# Patient Record
Sex: Female | Born: 1980 | Race: White | Hispanic: No | Marital: Married | Smoking: Never smoker
Health system: Southern US, Community
[De-identification: ages and names within clinical notes are randomized; demographics above are authoritative.]

## PROBLEM LIST (undated history)

## (undated) ENCOUNTER — Inpatient Hospital Stay (HOSPITAL_COMMUNITY): Payer: Self-pay

## (undated) DIAGNOSIS — D219 Benign neoplasm of connective and other soft tissue, unspecified: Secondary | ICD-10-CM

## (undated) DIAGNOSIS — R000 Tachycardia, unspecified: Secondary | ICD-10-CM

## (undated) DIAGNOSIS — F32A Depression, unspecified: Secondary | ICD-10-CM

## (undated) DIAGNOSIS — Z8679 Personal history of other diseases of the circulatory system: Secondary | ICD-10-CM

## (undated) DIAGNOSIS — Z973 Presence of spectacles and contact lenses: Secondary | ICD-10-CM

## (undated) DIAGNOSIS — Z789 Other specified health status: Secondary | ICD-10-CM

## (undated) DIAGNOSIS — D649 Anemia, unspecified: Secondary | ICD-10-CM

## (undated) DIAGNOSIS — F419 Anxiety disorder, unspecified: Secondary | ICD-10-CM

## (undated) HISTORY — PX: WISDOM TOOTH EXTRACTION: SHX21

## (undated) HISTORY — PX: CHOLECYSTECTOMY: SHX55

---

## 2002-05-11 ENCOUNTER — Other Ambulatory Visit: Admission: RE | Admit: 2002-05-11 | Discharge: 2002-05-11 | Payer: Self-pay | Admitting: Obstetrics and Gynecology

## 2004-06-13 ENCOUNTER — Ambulatory Visit (HOSPITAL_COMMUNITY): Admission: RE | Admit: 2004-06-13 | Discharge: 2004-06-13 | Payer: Self-pay | Admitting: Obstetrics and Gynecology

## 2004-07-17 ENCOUNTER — Inpatient Hospital Stay (HOSPITAL_COMMUNITY): Admission: AD | Admit: 2004-07-17 | Discharge: 2004-07-17 | Payer: Self-pay | Admitting: Obstetrics and Gynecology

## 2004-07-26 ENCOUNTER — Inpatient Hospital Stay (HOSPITAL_COMMUNITY): Admission: AD | Admit: 2004-07-26 | Discharge: 2004-07-27 | Payer: Self-pay | Admitting: Obstetrics & Gynecology

## 2004-07-30 ENCOUNTER — Inpatient Hospital Stay (HOSPITAL_COMMUNITY): Admission: AD | Admit: 2004-07-30 | Discharge: 2004-07-30 | Payer: Self-pay | Admitting: Obstetrics and Gynecology

## 2004-08-14 ENCOUNTER — Inpatient Hospital Stay (HOSPITAL_COMMUNITY): Admission: AD | Admit: 2004-08-14 | Discharge: 2004-08-16 | Payer: Self-pay | Admitting: Obstetrics & Gynecology

## 2004-08-17 ENCOUNTER — Encounter: Admission: RE | Admit: 2004-08-17 | Discharge: 2004-08-28 | Payer: Self-pay | Admitting: Obstetrics & Gynecology

## 2006-08-24 ENCOUNTER — Emergency Department (HOSPITAL_COMMUNITY): Admission: EM | Admit: 2006-08-24 | Discharge: 2006-08-24 | Payer: Self-pay | Admitting: Emergency Medicine

## 2006-10-11 ENCOUNTER — Encounter: Admission: RE | Admit: 2006-10-11 | Discharge: 2006-10-23 | Payer: Self-pay | Admitting: Occupational Medicine

## 2006-11-15 ENCOUNTER — Emergency Department (HOSPITAL_COMMUNITY): Admission: EM | Admit: 2006-11-15 | Discharge: 2006-11-15 | Payer: Self-pay | Admitting: Emergency Medicine

## 2008-01-20 ENCOUNTER — Inpatient Hospital Stay (HOSPITAL_COMMUNITY): Admission: AD | Admit: 2008-01-20 | Discharge: 2008-01-20 | Payer: Self-pay | Admitting: Obstetrics and Gynecology

## 2008-01-30 HISTORY — PX: CHOLECYSTECTOMY: SHX55

## 2008-02-13 ENCOUNTER — Inpatient Hospital Stay (HOSPITAL_COMMUNITY): Admission: AD | Admit: 2008-02-13 | Discharge: 2008-02-13 | Payer: Self-pay | Admitting: Obstetrics and Gynecology

## 2008-02-16 ENCOUNTER — Inpatient Hospital Stay (HOSPITAL_COMMUNITY): Admission: AD | Admit: 2008-02-16 | Discharge: 2008-02-18 | Payer: Self-pay | Admitting: Obstetrics and Gynecology

## 2008-04-13 ENCOUNTER — Ambulatory Visit (HOSPITAL_COMMUNITY): Admission: RE | Admit: 2008-04-13 | Discharge: 2008-04-13 | Payer: Self-pay | Admitting: Gastroenterology

## 2008-05-31 ENCOUNTER — Encounter (INDEPENDENT_AMBULATORY_CARE_PROVIDER_SITE_OTHER): Payer: Self-pay | Admitting: General Surgery

## 2008-05-31 ENCOUNTER — Ambulatory Visit (HOSPITAL_COMMUNITY): Admission: RE | Admit: 2008-05-31 | Discharge: 2008-05-31 | Payer: Self-pay | Admitting: General Surgery

## 2008-06-08 ENCOUNTER — Ambulatory Visit: Payer: Self-pay | Admitting: Vascular Surgery

## 2008-06-08 ENCOUNTER — Ambulatory Visit: Admission: RE | Admit: 2008-06-08 | Discharge: 2008-06-08 | Payer: Self-pay | Admitting: General Surgery

## 2008-06-08 ENCOUNTER — Encounter (INDEPENDENT_AMBULATORY_CARE_PROVIDER_SITE_OTHER): Payer: Self-pay | Admitting: General Surgery

## 2010-04-19 ENCOUNTER — Other Ambulatory Visit: Payer: Self-pay | Admitting: Obstetrics and Gynecology

## 2010-04-19 DIAGNOSIS — N6310 Unspecified lump in the right breast, unspecified quadrant: Secondary | ICD-10-CM

## 2010-04-20 ENCOUNTER — Ambulatory Visit
Admission: RE | Admit: 2010-04-20 | Discharge: 2010-04-20 | Disposition: A | Discharge status: Home / Self Care | Payer: Federal, State, Local not specified - PPO | Source: Ambulatory Visit | Attending: Obstetrics and Gynecology | Admitting: Obstetrics and Gynecology

## 2010-04-20 DIAGNOSIS — N6310 Unspecified lump in the right breast, unspecified quadrant: Secondary | ICD-10-CM

## 2010-05-10 LAB — COMPREHENSIVE METABOLIC PANEL
ALT: 28 U/L (ref 0–35)
Calcium: 9.2 mg/dL (ref 8.4–10.5)
Creatinine, Ser: 0.49 mg/dL (ref 0.4–1.2)
Glucose, Bld: 94 mg/dL (ref 70–99)
Sodium: 136 mEq/L (ref 135–145)
Total Protein: 7.3 g/dL (ref 6.0–8.3)

## 2010-05-10 LAB — CBC
Hemoglobin: 13.6 g/dL (ref 12.0–15.0)
MCHC: 35.4 g/dL (ref 30.0–36.0)
MCV: 87.7 fL (ref 78.0–100.0)
RDW: 13.1 % (ref 11.5–15.5)

## 2010-05-10 LAB — DIFFERENTIAL
Eosinophils Absolute: 0.2 10*3/uL (ref 0.0–0.7)
Lymphocytes Relative: 29 % (ref 12–46)
Lymphs Abs: 1.6 10*3/uL (ref 0.7–4.0)
Monocytes Relative: 5 % (ref 3–12)
Neutro Abs: 3.5 10*3/uL (ref 1.7–7.7)
Neutrophils Relative %: 62 % (ref 43–77)

## 2010-05-10 LAB — HCG, SERUM, QUALITATIVE: Preg, Serum: NEGATIVE

## 2010-05-15 LAB — CBC
HCT: 30.9 % — ABNORMAL LOW (ref 36.0–46.0)
HCT: 36.7 % (ref 36.0–46.0)
Hemoglobin: 10.6 g/dL — ABNORMAL LOW (ref 12.0–15.0)
Hemoglobin: 12.6 g/dL (ref 12.0–15.0)
MCHC: 34.2 g/dL (ref 30.0–36.0)
MCV: 91.8 fL (ref 78.0–100.0)
MCV: 93.2 fL (ref 78.0–100.0)
RBC: 3.31 MIL/uL — ABNORMAL LOW (ref 3.87–5.11)
RBC: 4 MIL/uL (ref 3.87–5.11)
RDW: 13.9 % (ref 11.5–15.5)
WBC: 9.3 10*3/uL (ref 4.0–10.5)

## 2010-06-13 NOTE — Op Note (Signed)
Kelly Keller, Kelly Keller               ACCOUNT NO.:  [id number]   MEDICAL RECORD NO.:  [id number]          PATIENT TYPE:  AMB   LOCATION:  SDS                          FACILITY:  [facility]   PHYSICIAN:  Cherylynn Ridges, M.D.    DATE OF BIRTH:  30-Aug-1980   DATE OF PROCEDURE:  05/31/2008  DATE OF DISCHARGE:  05/31/2008                               OPERATIVE REPORT   PREOPERATIVE DIAGNOSIS:  Gallbladder polyps and symptoms.   POSTOPERATIVE DIAGNOSIS:  Gallbladder polyps and symptoms.   PROCEDURE:  Laparoscopic cholecystectomy.   SURGEON:  Marta Lamas. Lindie Spruce, MD   ASSISTANT:  Amber L. Freida Busman, MD   ANESTHESIA:  General endotracheal.   ESTIMATED BLOOD LOSS:  Less than 20 mL.   No complications.   CONDITION:  Stable.   FINDINGS:  There were adhesions of omentum in duodenum to the  infundibulum of the gallbladder and the body, no cholangiogram was  performed.   INDICATIONS FOR OPERATION:  The patient is a 30 year old with noted  gallbladder polyps on ultrasound with symptoms associated with that who  now comes in for an elective laparoscopic cholecystectomy.   OPERATION:  The patient was taken to the operating room, placed on the  table in supine position.  After an adequate general endotracheal  anesthetic was administered, she was prepped and draped in the usual  sterile manner exposing the entire abdomen.   An infraumbilical midline incision was made using a #15 blade about 1 to  1.5 cm long down into the subcutaneous tissue to the midline fascia.  We  grabbed the fascia with 2 Kocher clamps and then incising the fascia  between the clamps into the preperitoneal space.  We bluntly dissected  down into the peritoneal cavity using a Kelly clamp, then once that was  done, we passed a purse-string suture of 0 Vicryl around the fascial  opening.  Then, secured in a Hasson cannula into the peritoneal cavity,  which we subsequently insufflated carbon dioxide gas up to a maximal  intraabdominal pressure of 15 mmHg.   Once the gas was in the abdomen, two right upper quadrant 5-mm cannula  and a subxiphoid 5-mm cannula passed under direct vision.  We then  switched to a 5-mm scope, then placed the patient in reverse  Trendelenburg and left side was tilted down and the dissection begun.   As we grabbed the dome of the gallbladder, retracted it towards the  anterior abdominal wall in the right upper quadrant, there were noted  adhesions to the infundibulum and the body.  We dissected these away  using blunt dissection and also electrocautery taking care to stay away  from the duodenum, which was also tethered by the adhesions.   We were able to dissect out the peritoneum overlying the triangle of  Calot and hepatoduodenal triangle isolating both the cystic duct and the  cystic artery providing adequate windows from both structures.  Endoclips were placed on the cystic duct distally x3 and then proximally  x2, and then we transected the cystic duct.  We then placed 2 clips  proximally and then 2  clips distally along the cystic artery, transected  it and then dissected out the gallbladder from its bed with minimal  difficulty making a small entrance into the gallbladder with spillage of  bile.  We used an EndoCatch bag to retrieve the gallbladder from the  infraumbilical site.  We then passed the scope back into the peritoneal  cavity cauterizing the hepatic bed for adequate hemostasis, and once  this was done, we removed all cannulas, aspirated all fluid and gas from  above the liver.   Once the cannulas were removed, the infraumbilical fascia site was  closed using a pursestring suture, which was in place.  We injected  0.25% Marcaine with epi at all sites.  We closed the infraumbilical site  using a running subcuticular stitch of 4-0 Monocryl.  We used Dermabond,  Steri-Strips, and Tegaderms to complete our dressings at all sites.  All  needle counts, sponge  counts, and instrument counts were correct.      Cherylynn Ridges, M.D.  Electronically Signed     JOW/MEDQ  D:  05/31/2008  T:  05/31/2008  Job:  [id number]   cc:   Anselmo Rod, M.D.

## 2010-06-13 NOTE — H&P (Signed)
Kelly Keller, Kelly Keller               ACCOUNT NO.:  [id number]   MEDICAL RECORD NO.:  [id number]          PATIENT TYPE:  INP   LOCATION:  9169                          FACILITY:  [facility]   PHYSICIAN:  Crist Fat. Rivard, M.D. DATE OF BIRTH:  06-23-1980   DATE OF ADMISSION:  02/16/2008  DATE OF DISCHARGE:                              HISTORY & PHYSICAL   This is a 30 year old gravida 2, para 1-0-0-1 at 33 and 6/7 weeks' who  presents with contractions every 3 minutes x2 hours.  She denies  leaking, reports positive fetal movement.  She did have 3 blood clots at  home.  Pregnancy has been followed by the Nurse-Midwife Service since 33  weeks and remarkable for history of asthma, history of preterm labor,  and Group B strep negative.  The patient is screaming with contractions  and unable to relax between, [place] desires epidural.   ALLERGIES:  PENICILLIN causes hives.   OB HISTORY:  Remarkable for vaginal delivery in 2006, a live female infant  at 70 weeks' gestation, weighing 6 pounds 12 ounces.  Remarkable for  preterm labor with term delivery.   MEDICAL HISTORY:  Remarkable for childhood varicella and history of  asthma.   SURGICAL HISTORY:  Negative.   FAMILY HISTORY:  Remarkable for grandmother with heart disease and  hypertension.  Son with asthma.  Mother with diabetes.  Grandmother with  thyroid problems.  Sister with seizures.  Grandmother with stomach  cancer.   GENETIC HISTORY:  Remarkable for father of the baby with twins, who is A  twin.   SOCIAL HISTORY:  The patient is married to Berneta Sages who is  involved and supportive.  She is of the [employer].  She denies any  alcohol, tobacco, or drug use.   PRENATAL LABS:  Hemoglobin 11.4 and platelets 191.  Blood type O  positive, antibody screen negative, RPR nonreactive, rubella immune,  hepatitis negative, HIV negative, Pap test normal, gonorrhea negative,  and Chlamydia negative.   HISTORY OF CURRENT PREGNANCY:  The  patient entered care at first  trimester at [facility], had an uneventful pregnancy with normal  genetic screening and a normal Glucola.  She transferred to [facility] at 33 weeks.  She had some preterm cramping at 33 weeks, but  had a negative fetal fibronectin.  She requested elective induction at  37 weeks' due to husband's trouble, but was told induction was not  recommended prior to 39 weeks.  She had group B strep negative at term  and presents today in labor.   OBJECTIVE:  VITAL SIGNS:  Stable and afebrile.  HEENT:  Within normal limits.  NECK:  Thyroid normal, not enlarged.  CHEST:  Clear to auscultation.  HEART:  Regular rate and rhythm.  ABDOMEN:  Gravid.  Vertex Leopold.  EFM shows reactive fetal heart rate  with contractions every 3 minutes.  Cervix is 3-4 cm, 50% effaced, -2  station with vertex presentation.  EXTREMITIES:  Within normal limits.   ASSESSMENT:  1. Intrauterine pregnancy at 37 and 6/7 weeks.  2. Early active labor.  PLAN:  1. Admit to birthing suites.  2. Routine CNM orders.  3. Epidural.  4. I reviewed with the patient that if contractions slowed down we may      need Pitocin augmentation later and she is agreeable.      Marie L. Williams, C.N.M.      Crist Fat Rivard, M.D.  Electronically Signed    MLW/MEDQ  D:  02/16/2008  T:  02/16/2008  Job:  [id number]

## 2010-06-16 NOTE — H&P (Signed)
NAMELASHUN, RAMSEYER               ACCOUNT NO.:  [id number]   MEDICAL RECORD NO.:  [id number]          PATIENT TYPE:  INP   LOCATION:  9168                          FACILITY:  [facility]   PHYSICIAN:  Richardean Sale, M.D.   DATE OF BIRTH:  11/14/1980   DATE OF ADMISSION:  08/14/2004  DATE OF DISCHARGE:                                HISTORY & PHYSICAL   ADMITTING DIAGNOSES:  A 40+ week intrauterine pregnancy for induction of  labor.   HISTORY OF PRESENT ILLNESS:  This is a 30 year old gravida 1, para 0 white  female who is at 98+ weeks gestation with a due date of August 13, 2004 by  first trimester ultrasound who presents today for induction of labor.  Patient's husband is being required to leave town on August 15, 2004 and will  be out of the country.  Patient reports good movement.  Denies loss of  fluid, vaginal contractions.  Prenatal care has been at [facility] with  Dr. Richardean Sale as the primary attending.  Pregnancy has been  uncomplicated.   PAST OBSTETRICAL HISTORY:  Gravida 1, para 0.   PAST GYNECOLOGICAL HISTORY:  No history of abnormal Pap smears or sexually  transmitted infections.   PAST MEDICAL HISTORY:  No prior hospitalizations.  She does have a remote  history of asthma.  Not currently on any medications.  Has never been  hospitalized for this.   PAST SURGICAL HISTORY:  None.   SOCIAL HISTORY:  She is married.  Denies tobacco, alcohol, or drugs.   FAMILY HISTORY:  Positive for hypertension, coronary artery disease in her  grandparents as well as adult-onset insulin-dependent diabetes.  No known  birth defects, congenital anomalies, cystic fibrosis, Down syndrome, or  spina bifida on either side of the family.   MEDICATIONS:  Prenatal vitamins.   ALLERGIES:  PENICILLIN causes hives.   PHYSICAL EXAMINATION:  VITAL SIGNS:  She is afebrile.  Vital signs are  stable.  GENERAL:  She is a well-developed, well-nourished white female who is in no  acute  distress.  HEART:  Regular rate and rhythm.  LUNGS:  Clear to auscultation bilaterally.  ABDOMEN:  Gravid, soft, and nontender.  Fundal height is appropriate.  EXTREMITIES:  Trace edema in the feet bilaterally, nontender.  PELVIC:  Cervix is 2-3 cm, 60%, -1 station, vertex.   Ultrasound on August 08, 2004 reveals estimated fetal weight of 3740 g which  is 70th percentile.  Amniotic fluid index is normal.  Position is vertex.   PRENATAL LABORATORIES:  Blood type is O+.  Antibody screen negative.  RPR  nonreactive.  Rubella immune.  Hepatitis B surface antigen nonreactive.  HIV  nonreactive.  One-hour Glucola was elevated at 142.  Three-hour Glucola was  normal with all four values being within normal limits.  Group B beta Strep  is negative.  Patient declined any prenatal testing such as ultra screen,  quad screen, or CF carrier screen.   ASSESSMENT:  A 40+ week intrauterine pregnancy for induction of labor.   PLAN:  1.  Reviewed with patient and husband that this  is an elective induction and      given that she is a primigravida she does have an increased risk of      cesarean section.  The patient and husband voiced understanding of this      risk and desire to proceed with induction.  The risks and benefits of      induction have been reviewed with the patient in detail.  Informed      consent has been obtained.  2.  Will admit to labor and delivery.  3.  Start Pitocin per protocol.  Perform amniotomy when appropriate.  4.  Continue fetal monitoring.  5.  Anticipate attempts at vaginal delivery.       JW/MEDQ  D:  08/14/2004  T:  08/14/2004  Job:  [id number]

## 2013-02-07 ENCOUNTER — Encounter (HOSPITAL_BASED_OUTPATIENT_CLINIC_OR_DEPARTMENT_OTHER): Payer: Self-pay | Admitting: Emergency Medicine

## 2013-02-07 ENCOUNTER — Emergency Department (HOSPITAL_BASED_OUTPATIENT_CLINIC_OR_DEPARTMENT_OTHER)
Admission: EM | Admit: 2013-02-07 | Discharge: 2013-02-07 | Disposition: A | Discharge status: Home / Self Care | Payer: Federal, State, Local not specified - PPO | Attending: Emergency Medicine | Admitting: Emergency Medicine

## 2013-02-07 DIAGNOSIS — S79929A Unspecified injury of unspecified thigh, initial encounter: Secondary | ICD-10-CM

## 2013-02-07 DIAGNOSIS — S161XXA Strain of muscle, fascia and tendon at neck level, initial encounter: Secondary | ICD-10-CM

## 2013-02-07 DIAGNOSIS — Y92410 Unspecified street and highway as the place of occurrence of the external cause: Secondary | ICD-10-CM | POA: Insufficient documentation

## 2013-02-07 DIAGNOSIS — M79602 Pain in left arm: Secondary | ICD-10-CM

## 2013-02-07 DIAGNOSIS — S79919A Unspecified injury of unspecified hip, initial encounter: Secondary | ICD-10-CM | POA: Insufficient documentation

## 2013-02-07 DIAGNOSIS — Y9389 Activity, other specified: Secondary | ICD-10-CM | POA: Insufficient documentation

## 2013-02-07 DIAGNOSIS — S4980XA Other specified injuries of shoulder and upper arm, unspecified arm, initial encounter: Secondary | ICD-10-CM | POA: Insufficient documentation

## 2013-02-07 DIAGNOSIS — S139XXA Sprain of joints and ligaments of unspecified parts of neck, initial encounter: Secondary | ICD-10-CM | POA: Insufficient documentation

## 2013-02-07 DIAGNOSIS — Z880 Allergy status to penicillin: Secondary | ICD-10-CM | POA: Insufficient documentation

## 2013-02-07 DIAGNOSIS — S46909A Unspecified injury of unspecified muscle, fascia and tendon at shoulder and upper arm level, unspecified arm, initial encounter: Secondary | ICD-10-CM | POA: Insufficient documentation

## 2013-02-07 DIAGNOSIS — M25552 Pain in left hip: Secondary | ICD-10-CM

## 2013-02-07 MED ORDER — CYCLOBENZAPRINE HCL 10 MG PO TABS: 10.0000 mg | ORAL_TABLET | Freq: Two times a day (BID) | ORAL | Status: DC | PRN
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
## 2013-02-07 NOTE — Discharge Instructions (Signed)
Use ice and ibuprofen or aleve for pain. Use flexeril or heat for muscle spasms.  If you were given medicines take as directed.  If you are on coumadin or contraceptives realize their levels and effectiveness is altered by many different medicines.  If you have any reaction (rash, tongues swelling, other) to the medicines stop taking and see a physician.   Please follow up as directed and return to the ER or see a physician for new or worsening symptoms (weakness, bone pain, other).  Thank you.

## 2014-09-24 ENCOUNTER — Other Ambulatory Visit: Payer: Self-pay | Admitting: Obstetrics & Gynecology

## 2014-09-27 LAB — CYTOLOGY - PAP

## 2015-07-06 DIAGNOSIS — K080 Exfoliation of teeth due to systemic causes: Secondary | ICD-10-CM | POA: Diagnosis not present

## 2015-07-29 DIAGNOSIS — N812 Incomplete uterovaginal prolapse: Secondary | ICD-10-CM | POA: Diagnosis not present

## 2015-08-23 DIAGNOSIS — K080 Exfoliation of teeth due to systemic causes: Secondary | ICD-10-CM | POA: Diagnosis not present

## 2015-10-04 DIAGNOSIS — Z6833 Body mass index (BMI) 33.0-33.9, adult: Secondary | ICD-10-CM | POA: Diagnosis not present

## 2015-10-04 DIAGNOSIS — Z01419 Encounter for gynecological examination (general) (routine) without abnormal findings: Secondary | ICD-10-CM | POA: Diagnosis not present

## 2015-10-11 DIAGNOSIS — Z1321 Encounter for screening for nutritional disorder: Secondary | ICD-10-CM | POA: Diagnosis not present

## 2015-10-11 DIAGNOSIS — Z1329 Encounter for screening for other suspected endocrine disorder: Secondary | ICD-10-CM | POA: Diagnosis not present

## 2015-10-11 DIAGNOSIS — Z13220 Encounter for screening for lipoid disorders: Secondary | ICD-10-CM | POA: Diagnosis not present

## 2015-10-11 DIAGNOSIS — R2231 Localized swelling, mass and lump, right upper limb: Secondary | ICD-10-CM | POA: Diagnosis not present

## 2015-10-11 DIAGNOSIS — Z13228 Encounter for screening for other metabolic disorders: Secondary | ICD-10-CM | POA: Diagnosis not present

## 2016-01-03 DIAGNOSIS — F419 Anxiety disorder, unspecified: Secondary | ICD-10-CM | POA: Diagnosis not present

## 2016-01-03 DIAGNOSIS — Z13220 Encounter for screening for lipoid disorders: Secondary | ICD-10-CM | POA: Diagnosis not present

## 2016-01-03 DIAGNOSIS — Z0000 Encounter for general adult medical examination without abnormal findings: Secondary | ICD-10-CM | POA: Diagnosis not present

## 2016-02-03 DIAGNOSIS — F419 Anxiety disorder, unspecified: Secondary | ICD-10-CM | POA: Diagnosis not present

## 2016-04-10 DIAGNOSIS — K080 Exfoliation of teeth due to systemic causes: Secondary | ICD-10-CM | POA: Diagnosis not present

## 2016-04-26 DIAGNOSIS — F419 Anxiety disorder, unspecified: Secondary | ICD-10-CM | POA: Diagnosis not present

## 2016-10-16 LAB — BASIC METABOLIC PANEL: Glucose: 97

## 2016-10-25 DIAGNOSIS — F419 Anxiety disorder, unspecified: Secondary | ICD-10-CM | POA: Diagnosis not present

## 2016-10-29 DIAGNOSIS — K080 Exfoliation of teeth due to systemic causes: Secondary | ICD-10-CM | POA: Diagnosis not present

## 2016-11-12 DIAGNOSIS — M79675 Pain in left toe(s): Secondary | ICD-10-CM | POA: Diagnosis not present

## 2016-11-12 DIAGNOSIS — L600 Ingrowing nail: Secondary | ICD-10-CM | POA: Diagnosis not present

## 2016-11-26 DIAGNOSIS — L600 Ingrowing nail: Secondary | ICD-10-CM | POA: Diagnosis not present

## 2016-11-26 DIAGNOSIS — M79675 Pain in left toe(s): Secondary | ICD-10-CM | POA: Diagnosis not present

## 2017-04-25 DIAGNOSIS — F419 Anxiety disorder, unspecified: Secondary | ICD-10-CM | POA: Diagnosis not present

## 2017-05-06 DIAGNOSIS — N911 Secondary amenorrhea: Secondary | ICD-10-CM | POA: Diagnosis not present

## 2017-05-06 DIAGNOSIS — F411 Generalized anxiety disorder: Secondary | ICD-10-CM | POA: Diagnosis not present

## 2017-05-20 DIAGNOSIS — N911 Secondary amenorrhea: Secondary | ICD-10-CM | POA: Diagnosis not present

## 2017-05-21 DIAGNOSIS — K080 Exfoliation of teeth due to systemic causes: Secondary | ICD-10-CM | POA: Diagnosis not present

## 2017-05-27 DIAGNOSIS — Z3481 Encounter for supervision of other normal pregnancy, first trimester: Secondary | ICD-10-CM | POA: Diagnosis not present

## 2017-05-27 DIAGNOSIS — Z3685 Encounter for antenatal screening for Streptococcus B: Secondary | ICD-10-CM | POA: Diagnosis not present

## 2017-05-27 DIAGNOSIS — Z13228 Encounter for screening for other metabolic disorders: Secondary | ICD-10-CM | POA: Diagnosis not present

## 2017-06-17 DIAGNOSIS — Z3401 Encounter for supervision of normal first pregnancy, first trimester: Secondary | ICD-10-CM | POA: Diagnosis not present

## 2017-06-17 DIAGNOSIS — Z113 Encounter for screening for infections with a predominantly sexual mode of transmission: Secondary | ICD-10-CM | POA: Diagnosis not present

## 2017-06-17 DIAGNOSIS — Z3A11 11 weeks gestation of pregnancy: Secondary | ICD-10-CM | POA: Diagnosis not present

## 2017-06-17 DIAGNOSIS — O26851 Spotting complicating pregnancy, first trimester: Secondary | ICD-10-CM | POA: Diagnosis not present

## 2017-06-17 DIAGNOSIS — Z3480 Encounter for supervision of other normal pregnancy, unspecified trimester: Secondary | ICD-10-CM | POA: Diagnosis not present

## 2017-07-01 DIAGNOSIS — Z3682 Encounter for antenatal screening for nuchal translucency: Secondary | ICD-10-CM | POA: Diagnosis not present

## 2017-07-01 DIAGNOSIS — O09521 Supervision of elderly multigravida, first trimester: Secondary | ICD-10-CM | POA: Diagnosis not present

## 2017-07-01 DIAGNOSIS — Z3A13 13 weeks gestation of pregnancy: Secondary | ICD-10-CM | POA: Diagnosis not present

## 2017-08-08 DIAGNOSIS — Z3480 Encounter for supervision of other normal pregnancy, unspecified trimester: Secondary | ICD-10-CM | POA: Diagnosis not present

## 2017-08-08 DIAGNOSIS — Z363 Encounter for antenatal screening for malformations: Secondary | ICD-10-CM | POA: Diagnosis not present

## 2017-08-08 DIAGNOSIS — Z3A18 18 weeks gestation of pregnancy: Secondary | ICD-10-CM | POA: Diagnosis not present

## 2017-08-28 ENCOUNTER — Other Ambulatory Visit: Payer: Self-pay

## 2017-08-28 ENCOUNTER — Encounter (HOSPITAL_COMMUNITY): Payer: Self-pay | Admitting: *Deleted

## 2017-08-28 ENCOUNTER — Inpatient Hospital Stay (HOSPITAL_COMMUNITY)
Admission: AD | Admit: 2017-08-28 | Discharge: 2017-08-28 | Disposition: A | Discharge status: Home / Self Care | Payer: Federal, State, Local not specified - PPO | Source: Ambulatory Visit | Attending: Obstetrics and Gynecology | Admitting: Obstetrics and Gynecology

## 2017-08-28 DIAGNOSIS — O479 False labor, unspecified: Secondary | ICD-10-CM

## 2017-08-28 DIAGNOSIS — Z3A22 22 weeks gestation of pregnancy: Secondary | ICD-10-CM | POA: Insufficient documentation

## 2017-08-28 DIAGNOSIS — Z880 Allergy status to penicillin: Secondary | ICD-10-CM | POA: Diagnosis not present

## 2017-08-28 DIAGNOSIS — O26852 Spotting complicating pregnancy, second trimester: Secondary | ICD-10-CM | POA: Insufficient documentation

## 2017-08-28 HISTORY — DX: Other specified health status: Z78.9

## 2017-08-28 NOTE — MAU Provider Note (Signed)
History     CSN: [id number]  Arrival date and time: 08/28/17 1657   First Provider Initiated Contact with Patient 08/28/17 1739      Chief Complaint  Patient presents with  . Contractions  . Vaginal Bleeding  . pelvic pressure   HPI  Ms. Kelly Keller is a 37 y.o. G3P2002 at [redacted]w[redacted]d who presents to MAU today with complaint of dark brown spotting x 2 days. She states that she also noted 3 dime sized blood clots earlier today which prompted her to call the office. She has had "braxton hicks" contractions 2-6 times/hour over the last 2-3 days. Contractions stop with rest and resume with activity. She reports normal fetal movement for this GA. She denies complications with this pregnancy. She had PTL with last pregnancy and had 2 MAU visits for "shots" to stop her contractions, but delivered at 37 weeks. Last [place] in the office was normal per patient, but incomplete views of heart were noted. Plan to repeat next week. She denies recent intercourse or exam. Last intercourse last week.   OB History    Gravida  3   Para  2   Term  2   Preterm      AB      Living  2     SAB      TAB      Ectopic      Multiple      Live Births  2           Past Medical History:  Diagnosis Date  . Medical history non-contributory     Past Surgical History:  Procedure Laterality Date  . CHOLECYSTECTOMY    . WISDOM TOOTH EXTRACTION Bilateral     Family History  Problem Relation Age of Onset  . Heart attack Mother     Social History   Tobacco Use  . Smoking status: Never Smoker  . Smokeless tobacco: Never Used  Substance Use Topics  . Alcohol use: Never    Frequency: Never  . Drug use: Never    Allergies:  Allergies  Allergen Reactions  . Penicillins Rash  . Tylenol With Codeine #3 [Acetaminophen-Codeine] Rash    Medications Prior to Admission  Medication Sig Dispense Refill Last Dose  . cyclobenzaprine (FLEXERIL) 10 MG tablet Take 1 tablet (10 mg total) by mouth 2  (two) times daily as needed for muscle spasms. 10 tablet 0     Review of Systems  Constitutional: Negative for fever.  Gastrointestinal: Positive for abdominal pain. Negative for constipation, diarrhea, nausea and vomiting.  Genitourinary: Positive for vaginal bleeding and vaginal discharge. Negative for dysuria, frequency and urgency.   Physical Exam   Blood pressure (!) 144/77, pulse (!) 111, temperature 98.8 F (37.1 C), temperature source Oral, resp. rate 17, weight 206 lb (93.4 kg), SpO2 100 %.  Physical Exam  Nursing note and vitals reviewed. Constitutional: She is oriented to person, place, and time. She appears well-developed and well-nourished. No distress.  HENT:  Head: Normocephalic and atraumatic.  Cardiovascular: Normal rate.  Respiratory: Effort normal.  GI: Soft. She exhibits no distension and no mass. There is no tenderness. There is no rebound and no guarding.  Genitourinary: Uterus is enlarged. Uterus is not tender. Cervix exhibits no motion tenderness, no discharge and no friability. There is bleeding (scant light brown in mucous) in the vagina. Vaginal discharge (moderate mucous) found.  Neurological: She is alert and oriented to person, place, and time.  Skin: Skin is  warm and dry. No erythema.  Psychiatric: She has a normal mood and affect.  Dilation: Closed Effacement (%): Thick Cervical Position: Posterior Exam by:: Kerry Hough, PA-C   MAU Course  Procedures None  MDM FHR - 146 bpm with doppler  Discussed patient with Dr. Julien Girt. If no evidence of cervical insufficiency or PTL ok for discharge with precautions and follow-up as scheduled next week.  Cervix closed, thick. Abdomen soft and non-tender. Normal FHR.  Assessment and Plan  A: [facility] at [redacted]w[redacted]d Spotting in pregnancy, second trimester   P: Discharge home Bleeding precautions and pelvic rest discussed Discussed use of abdominal binder and hydrotherapy for relief of abdominal pain Avoid heavy  lifting or strenuous activity  Patient advised to follow-up with [facility] as scheduled next week or sooner if symptoms worsen Patient may return to MAU as needed or if her condition were to change or worsen   Kerry Hough, PA-C 08/28/2017, 6:26 PM

## 2017-08-28 NOTE — MAU Note (Signed)
Urine in lab 

## 2017-08-28 NOTE — MAU Note (Signed)
Last 2 days has had brown spotted, heavier today- passed 3 dime sized clots.  Has been having [facility], feeling more pressure. Has been told she has a prolapsed uterus.  Has been constipated

## 2017-08-28 NOTE — Discharge Instructions (Signed)
Braxton Hicks Contractions °Contractions of the uterus can occur throughout pregnancy, but they are not always a sign that you are in labor. You may have practice contractions called Braxton Hicks contractions. These false labor contractions are sometimes confused with true labor. °What are Braxton Hicks contractions? °Braxton Hicks contractions are tightening movements that occur in the muscles of the uterus before labor. Unlike true labor contractions, these contractions do not result in opening (dilation) and thinning of the cervix. Toward the end of pregnancy (32-34 weeks), Braxton Hicks contractions can happen more often and may become stronger. These contractions are sometimes difficult to tell apart from true labor because they can be very uncomfortable. You should not feel embarrassed if you go to the hospital with false labor. °Sometimes, the only way to tell if you are in true labor is for your health care provider to look for changes in the cervix. The health care provider will do a physical exam and may monitor your contractions. If you are not in true labor, the exam should show that your cervix is not dilating and your water has not broken. °If there are other health problems associated with your pregnancy, it is completely safe for you to be sent home with false labor. You may continue to have Braxton Hicks contractions until you go into true labor. °How to tell the difference between true labor and false labor °True labor °· Contractions last 30-70 seconds. °· Contractions become very regular. °· Discomfort is usually felt in the top of the uterus, and it spreads to the lower abdomen and low back. °· Contractions do not go away with walking. °· Contractions usually become more intense and increase in frequency. °· The cervix dilates and gets thinner. °False labor °· Contractions are usually shorter and not as strong as true labor contractions. °· Contractions are usually irregular. °· Contractions  are often felt in the front of the lower abdomen and in the groin. °· Contractions may go away when you walk around or change positions while lying down. °· Contractions get weaker and are shorter-lasting as time goes on. °· The cervix usually does not dilate or become thin. °Follow these instructions at home: °· Take over-the-counter and prescription medicines only as told by your health care provider. °· Keep up with your usual exercises and follow other instructions from your health care provider. °· Eat and drink lightly if you think you are going into labor. °· If Braxton Hicks contractions are making you uncomfortable: °? Change your position from lying down or resting to walking, or change from walking to resting. °? Sit and rest in a tub of warm water. °? Drink enough fluid to keep your urine pale yellow. Dehydration may cause these contractions. °? Do slow and deep breathing several times an hour. °· Keep all follow-up prenatal visits as told by your health care provider. This is important. °Contact a health care provider if: °· You have a fever. °· You have continuous pain in your abdomen. °Get help right away if: °· Your contractions become stronger, more regular, and closer together. °· You have fluid leaking or gushing from your vagina. °· You pass blood-tinged mucus (bloody show). °· You have bleeding from your vagina. °· You have low back pain that you never had before. °· You feel your baby’s head pushing down and causing pelvic pressure. °· Your baby is not moving inside you as much as it used to. °Summary °· Contractions that occur before labor are called Braxton   Hicks contractions, false labor, or practice contractions.  Braxton Hicks contractions are usually shorter, weaker, farther apart, and less regular than true labor contractions. True labor contractions usually become progressively stronger and regular and they become more frequent.  Manage discomfort from [facility] contractions by  changing position, resting in a warm bath, drinking plenty of water, or practicing deep breathing. This information is not intended to replace advice given to you by your health care provider. Make sure you discuss any questions you have with your health care provider. Document Released: 05/31/2016 Document Revised: 05/31/2016 Document Reviewed: 05/31/2016 Elsevier Interactive Patient Education  2018 [employer].   Pelvic Rest Pelvic rest may be recommended if:  Your placenta is partially or completely covering the opening of your cervix (placenta previa).  There is bleeding between the wall of the uterus and the amniotic sac in the first trimester of pregnancy (subchorionic hemorrhage).  You went into labor too early (preterm labor).  Based on your overall health and the health of your baby, your health care provider will decide if pelvic rest is right for you. How do I rest my pelvis? For as long as told by your health care provider:  Do not have sex, sexual stimulation, or an orgasm.  Do not use tampons. Do not douche. Do not put anything in your vagina.  Do not lift anything that is heavier than 10 lb (4.5 kg).  Avoid activities that take a lot of effort (are strenuous).  Avoid any activity in which your pelvic muscles could become strained.  When should I seek medical care? Seek medical care if you have:  Cramping pain in your lower abdomen.  Vaginal discharge.  A low, dull backache.  Regular contractions.  Uterine tightening.  When should I seek immediate medical care? Seek immediate medical care if:  You have vaginal bleeding and you are pregnant.  This information is not intended to replace advice given to you by your health care provider. Make sure you discuss any questions you have with your health care provider. Document Released: 05/12/2010 Document Revised: 06/23/2015 Document Reviewed: 07/19/2014 Elsevier Interactive Patient Education  2018 [employer].   Preterm Labor and Birth Information Pregnancy normally lasts 39-41 weeks. Preterm labor is when labor starts early. It starts before you have been pregnant for 37 whole weeks. What are the risk factors for preterm labor? Preterm labor is more likely to occur in women who:  Have an infection while pregnant.  Have a cervix that is short.  Have gone into preterm labor before.  Have had surgery on their cervix.  Are younger than age 16.  Are older than age 59.  Are African American.  Are pregnant with two or more babies.  Take street drugs while pregnant.  Smoke while pregnant.  Do not gain enough weight while pregnant.  Got pregnant right after another pregnancy.  What are the symptoms of preterm labor? Symptoms of preterm labor include:  Cramps. The cramps may feel like the cramps some women get during their period. The cramps may happen with watery poop (diarrhea).  Pain in the belly (abdomen).  Pain in the lower back.  Regular contractions or tightening. It may feel like your belly is getting tighter.  Pressure in the lower belly that seems to get stronger.  More fluid (discharge) leaking from the vagina. The fluid may be watery or bloody.  Water breaking.  Why is it important to notice signs of preterm labor? Babies who are born  early may not be fully developed. They have a higher chance for:  Long-term heart problems.  Long-term lung problems.  Trouble controlling body systems, like breathing.  Bleeding in the brain.  A condition called cerebral palsy.  Learning difficulties.  Death.  These risks are highest for babies who are born before 1 weeks of pregnancy. How is preterm labor treated? Treatment depends on:  How long you were pregnant.  Your condition.  The health of your baby.  Treatment may involve:  Having a stitch (suture) placed in your cervix. When you give birth, your cervix opens so the baby can come out. The stitch  keeps the cervix from opening too soon.  Staying at the hospital.  Taking or getting medicines, such as: ? Hormone medicines. ? Medicines to stop contractions. ? Medicines to help the babys lungs develop. ? Medicines to prevent your baby from having cerebral palsy.  What should I do if I am in preterm labor? If you think you are going into labor too soon, call your doctor right away. How can I prevent preterm labor?  Do not use any tobacco products. ? Examples of these are cigarettes, chewing tobacco, and e-cigarettes. ? If you need help quitting, ask your doctor.  Do not use street drugs.  Do not use any medicines unless you ask your doctor if they are safe for you.  Talk with your doctor before taking any herbal supplements.  Make sure you gain enough weight.  Watch for infection. If you think you might have an infection, get it checked right away.  If you have gone into preterm labor before, tell your doctor. This information is not intended to replace advice given to you by your health care provider. Make sure you discuss any questions you have with your health care provider. Document Released: 04/13/2008 Document Revised: 06/28/2015 Document Reviewed: 06/08/2015 Elsevier Interactive Patient Education  2018 [employer].

## 2017-09-03 DIAGNOSIS — Z362 Encounter for other antenatal screening follow-up: Secondary | ICD-10-CM | POA: Diagnosis not present

## 2017-09-03 DIAGNOSIS — Z3A22 22 weeks gestation of pregnancy: Secondary | ICD-10-CM | POA: Diagnosis not present

## 2017-10-01 DIAGNOSIS — Z3A26 26 weeks gestation of pregnancy: Secondary | ICD-10-CM | POA: Diagnosis not present

## 2017-10-01 DIAGNOSIS — O4403 Placenta previa specified as without hemorrhage, third trimester: Secondary | ICD-10-CM | POA: Diagnosis not present

## 2017-10-01 DIAGNOSIS — Z23 Encounter for immunization: Secondary | ICD-10-CM | POA: Diagnosis not present

## 2017-10-01 DIAGNOSIS — Z3480 Encounter for supervision of other normal pregnancy, unspecified trimester: Secondary | ICD-10-CM | POA: Diagnosis not present

## 2017-10-15 DIAGNOSIS — O4403 Placenta previa specified as without hemorrhage, third trimester: Secondary | ICD-10-CM | POA: Diagnosis not present

## 2017-10-15 DIAGNOSIS — Z3A28 28 weeks gestation of pregnancy: Secondary | ICD-10-CM | POA: Diagnosis not present

## 2017-10-16 ENCOUNTER — Inpatient Hospital Stay (HOSPITAL_BASED_OUTPATIENT_CLINIC_OR_DEPARTMENT_OTHER): Payer: Federal, State, Local not specified - PPO

## 2017-10-16 ENCOUNTER — Encounter (HOSPITAL_COMMUNITY): Payer: Self-pay | Admitting: *Deleted

## 2017-10-16 ENCOUNTER — Other Ambulatory Visit: Payer: Self-pay

## 2017-10-16 ENCOUNTER — Inpatient Hospital Stay (HOSPITAL_COMMUNITY)
Admission: AD | Admit: 2017-10-16 | Discharge: 2017-10-19 | DRG: 833 | Disposition: A | Discharge status: Home / Self Care | Payer: Federal, State, Local not specified - PPO | Attending: Obstetrics and Gynecology | Admitting: Obstetrics and Gynecology

## 2017-10-16 DIAGNOSIS — O4413 Placenta previa with hemorrhage, third trimester: Principal | ICD-10-CM | POA: Diagnosis present

## 2017-10-16 DIAGNOSIS — O09523 Supervision of elderly multigravida, third trimester: Secondary | ICD-10-CM | POA: Diagnosis not present

## 2017-10-16 DIAGNOSIS — D649 Anemia, unspecified: Secondary | ICD-10-CM | POA: Diagnosis present

## 2017-10-16 DIAGNOSIS — Z363 Encounter for antenatal screening for malformations: Secondary | ICD-10-CM

## 2017-10-16 DIAGNOSIS — Z3A29 29 weeks gestation of pregnancy: Secondary | ICD-10-CM

## 2017-10-16 DIAGNOSIS — Z3A30 30 weeks gestation of pregnancy: Secondary | ICD-10-CM | POA: Diagnosis not present

## 2017-10-16 DIAGNOSIS — Z880 Allergy status to penicillin: Secondary | ICD-10-CM | POA: Diagnosis not present

## 2017-10-16 DIAGNOSIS — O99013 Anemia complicating pregnancy, third trimester: Secondary | ICD-10-CM | POA: Diagnosis not present

## 2017-10-16 DIAGNOSIS — O4703 False labor before 37 completed weeks of gestation, third trimester: Secondary | ICD-10-CM | POA: Diagnosis not present

## 2017-10-16 DIAGNOSIS — Z3689 Encounter for other specified antenatal screening: Secondary | ICD-10-CM

## 2017-10-16 LAB — COMPREHENSIVE METABOLIC PANEL
ALT: 13 U/L (ref 0–44)
AST: 14 U/L — ABNORMAL LOW (ref 15–41)
Albumin: 3.3 g/dL — ABNORMAL LOW (ref 3.5–5.0)
Alkaline Phosphatase: 75 U/L (ref 38–126)
Anion gap: 10 (ref 5–15)
BUN: 6 mg/dL (ref 6–20)
CO2: 17 mmol/L — ABNORMAL LOW (ref 22–32)
Calcium: 9.3 mg/dL (ref 8.9–10.3)
Chloride: 104 mmol/L (ref 98–111)
Creatinine, Ser: 0.42 mg/dL — ABNORMAL LOW (ref 0.44–1.00)
GFR calc Af Amer: >60 mL/min (ref >60)
GFR calc non Af Amer: >60 mL/min (ref >60)
Glucose, Bld: 125 mg/dL — ABNORMAL HIGH (ref 70–99)
Potassium: 3.6 mmol/L (ref 3.5–5.1)
Sodium: 131 mmol/L — ABNORMAL LOW (ref 135–145)
Total Bilirubin: 0.4 mg/dL (ref 0.3–1.2)
Total Protein: 6.3 g/dL — ABNORMAL LOW (ref 6.5–8.1)

## 2017-10-16 LAB — TYPE AND SCREEN
ABO/RH(D): O POS
Antibody Screen: NEGATIVE

## 2017-10-16 LAB — URINALYSIS, ROUTINE W REFLEX MICROSCOPIC
Bilirubin Urine: NEGATIVE
Glucose, UA: NEGATIVE mg/dL
Ketones, ur: NEGATIVE mg/dL
Leukocytes, UA: NEGATIVE
Nitrite: NEGATIVE
Protein, ur: NEGATIVE mg/dL
Specific Gravity, Urine: 1.013 (ref 1.005–1.030)
pH: 6 (ref 5.0–8.0)

## 2017-10-16 LAB — CBC
HCT: 32.6 % — ABNORMAL LOW (ref 36.0–46.0)
Hemoglobin: 11 g/dL — ABNORMAL LOW (ref 12.0–15.0)
MCH: 29.7 pg (ref 26.0–34.0)
MCHC: 33.7 g/dL (ref 30.0–36.0)
MCV: 88.1 fL (ref 78.0–100.0)
Platelets: 168 10*3/uL (ref 150–400)
RBC: 3.7 MIL/uL — ABNORMAL LOW (ref 3.87–5.11)
RDW: 14.3 % (ref 11.5–15.5)
WBC: 9 10*3/uL (ref 4.0–10.5)

## 2017-10-16 LAB — ABO/RH: ABO/RH(D): O POS

## 2017-10-16 MED ORDER — DOCUSATE SODIUM 100 MG PO CAPS
100.0000 mg | ORAL_CAPSULE | Freq: Every day | ORAL | Status: DC
  Administered 2017-10-16 – 2017-10-19 (×4): 100 mg via ORAL
  Filled 2017-10-16 (×4): qty 1

## 2017-10-16 MED ORDER — CALCIUM CARBONATE ANTACID 500 MG PO CHEW: 2.0000 | CHEWABLE_TABLET | ORAL | Status: DC | PRN

## 2017-10-16 MED ORDER — ZOLPIDEM TARTRATE 5 MG PO TABS: 5.0000 mg | ORAL_TABLET | Freq: Every evening | ORAL | Status: DC | PRN

## 2017-10-16 MED ORDER — LACTATED RINGERS IV SOLN
INTRAVENOUS | Status: DC
  Administered 2017-10-16 – 2017-10-17 (×2): 125 mL/h via INTRAVENOUS

## 2017-10-16 MED ORDER — MAGNESIUM SULFATE 40 G IN LACTATED RINGERS - SIMPLE
2.0000 g/h | INTRAVENOUS | Status: AC
  Administered 2017-10-16: 4 g/h via INTRAVENOUS
  Filled 2017-10-16: qty 500

## 2017-10-16 MED ORDER — MAGNESIUM SULFATE BOLUS VIA INFUSION
4.0000 g | Freq: Once | INTRAVENOUS | Status: DC
  Filled 2017-10-16: qty 500

## 2017-10-16 MED ORDER — SODIUM CHLORIDE 0.9% FLUSH
3.0000 mL | Freq: Two times a day (BID) | INTRAVENOUS | Status: DC
  Administered 2017-10-17 – 2017-10-18 (×3): 3 mL via INTRAVENOUS

## 2017-10-16 MED ORDER — SODIUM CHLORIDE 0.9 % IV SOLN: 250.0000 mL | INTRAVENOUS | Status: DC | PRN

## 2017-10-16 MED ORDER — BETAMETHASONE SOD PHOS & ACET 6 (3-3) MG/ML IJ SUSP
12.0000 mg | INTRAMUSCULAR | Status: AC
  Administered 2017-10-16 – 2017-10-17 (×2): 12 mg via INTRAMUSCULAR
  Filled 2017-10-16 (×2): qty 2

## 2017-10-16 MED ORDER — PRENATAL MULTIVITAMIN CH
1.0000 | ORAL_TABLET | Freq: Every day | ORAL | Status: DC
  Administered 2017-10-16 – 2017-10-19 (×4): 1 via ORAL
  Filled 2017-10-16 (×4): qty 1

## 2017-10-16 MED ORDER — ACETAMINOPHEN 325 MG PO TABS: 650.0000 mg | ORAL_TABLET | ORAL | Status: DC | PRN

## 2017-10-16 MED ORDER — SODIUM CHLORIDE 0.9% FLUSH
3.0000 mL | INTRAVENOUS | Status: DC | PRN
  Administered 2017-10-17: 3 mL via INTRAVENOUS
  Filled 2017-10-16: qty 3

## 2017-10-16 NOTE — Progress Notes (Signed)
Pt denies active VB - has scant old dark blood on pad.  No pain or ctx.  + FM  + FHT AF, VSS Gen - NAD Abd - gravid, NT  A/P:  Placenta previa Continue BMZ Mag x 12 hrs Continue inpatient mngt

## 2017-10-16 NOTE — Progress Notes (Signed)
Contact with Dr. Julien Girt in regards to patient being taken off of monitor while she eats meals. EFM is difficulty to trace during those time. Pt may also get up to bathroom for daily cares. Toya Smothers, RN

## 2017-10-16 NOTE — H&P (Signed)
Kelly Keller is a 37 y.o. female presenting for large gush of blood at 130 this am.  Pregnancy complicated by AMA with normal NIPS and placenta Previa.   In MAU, bleeding was minimal, but clot noted at cx by NP.  Also noted to have uterine irritability.. OB History    Gravida  3   Para  2   Term  2   Preterm      AB      Living  2     SAB      TAB      Ectopic      Multiple      Live Births  2          Past Medical History:  Diagnosis Date  . Medical history non-contributory    Past Surgical History:  Procedure Laterality Date  . CHOLECYSTECTOMY    . WISDOM TOOTH EXTRACTION Bilateral    Family History: family history includes Heart attack in her mother. Social History:  reports that she has never smoked. She has never used smokeless tobacco. She reports that she does not drink alcohol or use drugs.     Maternal Diabetes: No Genetic Screening: Normal Maternal Ultrasounds/Referrals: Normal Fetal Ultrasounds or other Referrals:  None Maternal Substance Abuse:  No Significant Maternal Medications:  None Significant Maternal Lab Results:  None Other Comments:  None  ROS History   Blood pressure 127/75, pulse 93, temperature 98.8 F (37.1 C), temperature source Oral, resp. rate 18, height 5\' 1"  (1.549 m), weight 95.3 kg, SpO2 99 %. Exam Physical Exam  SSE per NP Visually closed cx.  Small amount of blood in vagina (old blood) Prenatal labs: ABO, Rh: --/--/O POS, O POS Performed at [facility], [address], [place], [place] [address]  [phone]) Antibody: NEG (09/18 0329) Rubella:   RPR:    HBsAg:    HIV:    GBS:     Assessment/Plan: IUP at 29 weeks Complete Placenta Previa with vaginal bleeding Bleeding stable. T&S ordered.  Mid anemia on CBC US done c/w  Above but official report pending Admit for Magnesium for CP prophylaxis and uterine irritability BMZ series  Discussed admission until 1 week of no vag bleeding  Kelly Keller  C 10/16/2017, 6:48 AM

## 2017-10-16 NOTE — MAU Provider Note (Signed)
History     CSN: [id number]  Arrival date and time: 10/16/17 0208   First Provider Initiated Contact with Patient 10/16/17 0253      Chief Complaint  Patient presents with  . Vaginal Bleeding    placenta previa   Kelly Keller is a 37 y.o. G3P2 at [redacted]w[redacted]d who presents to MAU with vaginal bleeding. This pregnancy is complicated by a complete previa. She reports vaginal bleeding started occurring at 0130 this morning. She woke up to a "gush of fluid" and thought she might have urinated but noticed dark red vaginal bleeding. Denies passing clots or soaking through pads. Wore a pad on the way to MAU and noticed 3-4cm of dark red blood. She reports vaginal bleeding is associated with abdominal pain. Describes abdominal pain as "tightening and period cramps", rates pain 4/10. She reports abdominal cramping has been occurring for 1-2 weeks but now they are noticeable and worsening. She reports being diagnosed with complete previa at [redacted] weeks gestation after being seen in MAU 7/31 for vaginal bleeding. Denies hx of hypertension or diabetes during this pregnancy.   OB History    Gravida  3   Para  2   Term  2   Preterm      AB      Living  2     SAB      TAB      Ectopic      Multiple      Live Births  2           Past Medical History:  Diagnosis Date  . Medical history non-contributory     Past Surgical History:  Procedure Laterality Date  . CHOLECYSTECTOMY    . WISDOM TOOTH EXTRACTION Bilateral     Family History  Problem Relation Age of Onset  . Heart attack Mother     Social History   Tobacco Use  . Smoking status: Never Smoker  . Smokeless tobacco: Never Used  Substance Use Topics  . Alcohol use: Never    Frequency: Never  . Drug use: Never    Allergies:  Allergies  Allergen Reactions  . Penicillins Rash  . Tylenol With Codeine #3 [Acetaminophen-Codeine] Rash    Medications Prior to Admission  Medication Sig Dispense Refill Last Dose  .  Prenatal Vit-Fe Fumarate-FA (MULTIVITAMIN-PRENATAL) 27-0.8 MG TABS tablet Take 1 tablet by mouth daily at 12 noon.   10/15/2017 at Unknown time  . cyclobenzaprine (FLEXERIL) 10 MG tablet Take 1 tablet (10 mg total) by mouth 2 (two) times daily as needed for muscle spasms. 10 tablet 0     Review of Systems  Constitutional: Negative.   Respiratory: Negative.   Cardiovascular: Negative.   Gastrointestinal: Positive for abdominal pain. Negative for constipation, diarrhea, nausea and vomiting.  Genitourinary: Positive for vaginal bleeding. Negative for difficulty urinating, dysuria, frequency, pelvic pain and vaginal pain.  Neurological: Negative.    Physical Exam   Blood pressure 136/82, pulse 98, temperature 98.4 F (36.9 C), resp. rate 17.  Physical Exam  Nursing note and vitals reviewed. Constitutional: She is oriented to person, place, and time. She appears well-developed and well-nourished. No distress.  HENT:  Head: Normocephalic.  Cardiovascular: Normal rate, regular rhythm and normal heart sounds.  Respiratory: Effort normal and breath sounds normal. No respiratory distress.  GI: Soft.  Gravid appropriate for gestational age, mild contractions palpated  Genitourinary: There is bleeding in the vagina.  Genitourinary Comments: Upon sterile speculum exam, small amount of  dark red vaginal bleeding present (2 faux swabs used to visualize cervix), 1 blood clot at cervical os, cervix visually closed- cervical examination deferred   Musculoskeletal: Normal range of motion. She exhibits no edema.  Neurological: She is alert and oriented to person, place, and time.  Skin: Skin is warm and dry.  Psychiatric: She has a normal mood and affect. Her behavior is normal. Thought content normal.   FHR: 135/ moderate variability/ +accels/ one variable deceleration  Toco: 3-4 minutes with UI/ UC mild by palpation   MAU Course  Procedures  MDM CBC CMP  Type and screen  IV inserted and LR  maintenance initiated  [place] MFM OB follow up- for fetal growth and visualization of placenta  BMZ   Results for orders placed or performed during the hospital encounter of 10/16/17 (from the past 24 hour(s))  Urinalysis, Routine w reflex microscopic     Status: Abnormal   Collection Time: 10/16/17  3:19 AM  Result Value Ref Range   Color, Urine YELLOW YELLOW   APPearance CLEAR CLEAR   Specific Gravity, Urine 1.013 1.005 - 1.030   pH 6.0 5.0 - 8.0   Glucose, UA NEGATIVE NEGATIVE mg/dL   Hgb urine dipstick LARGE (A) NEGATIVE   Bilirubin Urine NEGATIVE NEGATIVE   Ketones, ur NEGATIVE NEGATIVE mg/dL   Protein, ur NEGATIVE NEGATIVE mg/dL   Nitrite NEGATIVE NEGATIVE   Leukocytes, UA NEGATIVE NEGATIVE   RBC / HPF 6-10 0 - 5 RBC/hpf   WBC, UA 0-5 0 - 5 WBC/hpf   Bacteria, UA FEW (A) NONE SEEN   Squamous Epithelial / LPF 0-5 0 - 5   Mucus PRESENT   Type and screen [place]     Status: None   Collection Time: 10/16/17  3:29 AM  Result Value Ref Range   ABO/RH(D) O POS    Antibody Screen NEG    Sample Expiration      10/19/2017 Performed at [facility], [address], [place], [place] [address]   CBC on admission     Status: Abnormal   Collection Time: 10/16/17  3:30 AM  Result Value Ref Range   WBC 9.0 4.0 - 10.5 K/uL   RBC 3.70 (L) 3.87 - 5.11 MIL/uL   Hemoglobin 11.0 (L) 12.0 - 15.0 g/dL   HCT 32.6 (L) 36.0 - 46.0 %   MCV 88.1 78.0 - 100.0 fL   MCH 29.7 26.0 - 34.0 pg   MCHC 33.7 30.0 - 36.0 g/dL   RDW 14.3 11.5 - 15.5 %   Platelets 168 150 - 400 K/uL  Comprehensive metabolic panel     Status: Abnormal   Collection Time: 10/16/17  3:30 AM  Result Value Ref Range   Sodium 131 (L) 135 - 145 mmol/L   Potassium 3.6 3.5 - 5.1 mmol/L   Chloride 104 98 - 111 mmol/L   CO2 17 (L) 22 - 32 mmol/L   Glucose, Bld 125 (H) 70 - 99 mg/dL   BUN 6 6 - 20 mg/dL   Creatinine, Ser 0.42 (L) 0.44 - 1.00 mg/dL   Calcium 9.3 8.9 - 10.3 mg/dL   Total Protein 6.3 (L)  6.5 - 8.1 g/dL   Albumin 3.3 (L) 3.5 - 5.0 g/dL   AST 14 (L) 15 - 41 U/L   ALT 13 0 - 44 U/L   Alkaline Phosphatase 75 38 - 126 U/L   Total Bilirubin 0.4 0.3 - 1.2 mg/dL   GFR calc non Af Amer >  60 >60 mL/min   GFR calc Af Amer >60 >60 mL/min   Anion gap 10 5 - 15  Labs reviewed: CBC and CMP - elevated glucose of 125 otherwise WNL   Consults with Dr Corinna Capra on assessment and management. Patient to be admitted to [facility] for 7 days after bleeding resolves. Recommends magnesium sulfate to be initiated. Discussed plan of care with patient, patient and family questions answered.   Orders placed for admission to antenatal  [place] preliminary report reviewed showed:  -Anterior previa, CL 5cm, FHR 135, AFV: WNL and variable presentation Final report for [place] pending   Assessment and Plan   1. [redacted] weeks gestation of pregnancy   2. Complete placenta previa with hemorrhage, third trimester   3. Encounter for ultrasound to assess fetal growth    Admit to Antenatal  Care taken over by Dr Corinna Capra  Orders placed  Magnesium Sulfate initiated  Bedrest with bathroom privileges  Second BMZ dose tomorrow   Lajean Manes CNM 10/16/2017, 5:05 AM

## 2017-10-16 NOTE — MAU Note (Signed)
Pt presents to MAU c/o vaginal bleeding with placenta previa. Pt states around 0130 she noticed a gush feeling and went to the BR and noticed a small amount of vaginal bleeding. Pt put on a pad and came to MAU. Pt reports lower abdominal cramping that she rates 4/10. +FM yesterday.

## 2017-10-17 DIAGNOSIS — O4413 Placenta previa with hemorrhage, third trimester: Secondary | ICD-10-CM | POA: Diagnosis not present

## 2017-10-17 MED ORDER — FAMOTIDINE 20 MG PO TABS
20.0000 mg | ORAL_TABLET | Freq: Every day | ORAL | Status: DC
  Administered 2017-10-17 – 2017-10-19 (×3): 20 mg via ORAL
  Filled 2017-10-17 (×3): qty 1

## 2017-10-17 NOTE — Progress Notes (Signed)
No current c/o.  No new vaginal bleeding.  Active FM.  No cramping/CTX.  VSS. AF. FHT Cat I Toco flat  Gen: A&O x 3 Abd: soft, NT Ext: no c/c/e  37 yo G3P2002 at [redacted]w[redacted]d with previa; first bleed -S/P BMZ and magnesium sulfate -Keep in house x 7 days to ensure stability  Linda Hedges, DO

## 2017-10-18 DIAGNOSIS — O4413 Placenta previa with hemorrhage, third trimester: Secondary | ICD-10-CM | POA: Diagnosis not present

## 2017-10-18 NOTE — Progress Notes (Signed)
[redacted]w[redacted]d   S// no further bleeding, good FM  O//BP 109/60 (BP Location: Right Arm)   Pulse 89   Temp 98.2 F (36.8 C) (Oral)   Resp 18   Ht 5\' 1"  (1.549 m)   Wt 95.3 kg   SpO2 100%   BMI 39.68 kg/m   FHR stable  A+P//[redacted]w[redacted]d, previa, S/P BMZ, in house protocol for obsv X 7 days

## 2017-10-19 DIAGNOSIS — O4413 Placenta previa with hemorrhage, third trimester: Secondary | ICD-10-CM | POA: Diagnosis not present

## 2017-10-19 LAB — TYPE AND SCREEN
ABO/RH(D): O POS
Antibody Screen: NEGATIVE

## 2017-10-19 NOTE — Discharge Summary (Signed)
Physician Discharge Summary  Patient ID: Kelly Keller MRN: [id number] DOB/AGE: Aug 23, 1980 37 y.o.  Admit date: 10/16/2017 Discharge date: 10/19/2017  Admission Diagnoses:[redacted]w[redacted]d IUP, PREVIA   Discharge Diagnoses: SAME Active Problems:   Complete placenta previa with hemorrhage, third trimester   Discharged Condition: good  Hospital Course: adm for light bleeding w/ hx previa>>>received BMZ series + MgSO4 brieflywith cont obsv, she had no further bleeding and did not want to remain for 7 day in hospt obsv period  Consults: None  Significant Diagnostic Studies: labs:  CBC    Component Value Date/Time   WBC 9.0 10/16/2017 0330   RBC 3.70 (L) 10/16/2017 0330   HGB 11.0 (L) 10/16/2017 0330   HCT 32.6 (L) 10/16/2017 0330   PLT 168 10/16/2017 0330   MCV 88.1 10/16/2017 0330   MCH 29.7 10/16/2017 0330   MCHC 33.7 10/16/2017 0330   RDW 14.3 10/16/2017 0330   LYMPHSABS 1.6 05/27/2008 1007   MONOABS 0.3 05/27/2008 1007   EOSABS 0.2 05/27/2008 1007   BASOSABS 0.0 05/27/2008 1007     Treatments: BMZ+tocolytics  Discharge Exam: Blood pressure 109/65, pulse 84, temperature 98.6 F (37 C), temperature source Oral, resp. rate 17, height 5\' 1"  (1.549 m), weight 95.3 kg, SpO2 100 %. fundus soft, FHR 142  Disposition: Discharge disposition: 01-Home or Self Care        Allergies as of 10/19/2017      Reactions   Penicillins Rash   Tylenol With Codeine #3 [acetaminophen-codeine] Rash      Medication List    STOP taking these medications   cyclobenzaprine 10 MG tablet Commonly known as:  FLEXERIL     TAKE these medications   multivitamin-prenatal 27-0.8 MG Tabs tablet Take 1 tablet by mouth daily at 12 noon.      Follow-up Information    [place] Follow up.   Why:  office will call for appt on Monday Contact information: [address] [place] [address] [phone]           Signed: Margarette Asal 10/19/2017, 7:40 AM

## 2017-10-19 NOTE — Progress Notes (Signed)
[redacted]w[redacted]d  Very stable, pt desires to go home>>explained usual 7 day obsv period, she is requsting D/C today since stable and she has some one with her for emergency contact, need for mod BR/pelvic rest reviewed again, will be seen in office Monday(48 hrs)

## 2017-10-19 NOTE — Discharge Instructions (Signed)
Placenta Previa Placenta previa is a condition in which the placenta implants in the lower part of the uterus in pregnant women. The placenta either partially or completely covers the opening to the cervix. This is a problem because the baby must pass through the cervix during delivery. There are three types of placenta previa:  Marginal placenta previa. The placenta reaches within an inch (2.5 cm) of the cervical opening but does not cover it.  Partial placenta previa. The placenta covers part of the cervical opening.  Complete placenta previa. The placenta covers the entire cervical opening.  If the previa is marginal or partial and it is diagnosed in the first half of pregnancy, the placenta may move into a normal position as the pregnancy progresses and may no longer cover the cervix. It is important to keep all prenatal visits with your health care provider so you can be more closely monitored. What are the causes? The cause of this condition is not known. What increases the risk? This condition is more likely to develop in women who:  Are carrying more than one baby (multiples).  Have an abnormally shaped uterus.  Have scars on the lining of the uterus.  Have had surgeries involving the uterus, such as a cesarean delivery.  Have delivered a baby before.  Have a history of placenta previa.  Have smoked or used cocaine during pregnancy.  Are age 36 or older during pregnancy.  What are the signs or symptoms? The main symptom of this condition is sudden, painless vaginal bleeding during the second half of pregnancy. The amount of bleeding can be very light at first, and it usually stops on its own. Heavier bleeding episodes may also happen. Some women with placenta previa may have no bleeding at all. How is this diagnosed?  This condition is diagnosed: ? From an ultrasound. This test uses sound waves to find where the placenta is located before you have any bleeding  episodes. ? During a checkup after vaginal bleeding is noticed.  If you are diagnosed with a partial or complete previa, digital exams with fingers will generally be avoided. Your health care provider will still perform a speculum exam.  If you did not have an ultrasound during your pregnancy, placenta previa may not be diagnosed until bleeding occurs during labor. How is this treated? Treatment for this condition may include:  Decreased activity.  Bed rest at home or in the hospital.  Pelvic rest. Nothing is placed inside the vagina during pelvic rest. This means not having sex and not using tampons or douches.  A blood transfusion to replace blood that you have lost (maternal blood loss).  A cesarean delivery. This may be performed if: ? The bleeding is heavy and cannot be controlled. ? The placenta completely covers the cervix.  Medicines to stop premature labor or to help the baby's lungs to mature. This treatment may be used if you need delivery before your pregnancy is full-term.  Your treatment will be decided based on:  How much you are bleeding, or whether the bleeding has stopped.  How far along you are in your pregnancy.  The condition of your baby.  The type of placenta previa that you have.  Follow these instructions at home:  Get plenty of rest and lessen activity as told by your health care provider.  Stay on bed rest for as long as told by your health care provider.  Do not have sex, use tampons, use a douche, or place  anything inside of your vagina if your health care provider recommended pelvic rest.  Take over-the-counter and prescription medicines as told by your health care provider.  Keep all follow-up visits as told by your health care provider. This is important. Get help right away if:  You have vaginal bleeding, even if in small amounts and even if you have no pain.  You have cramping or regular contractions.  You have pain in your abdomen  or your lower back.  You have a feeling of increased pressure in your pelvis.  You have increased watery or bloody mucus from the vagina. This information is not intended to replace advice given to you by your health care provider. Make sure you discuss any questions you have with your health care provider. Document Released: 01/15/2005 Document Revised: 10/05/2015 Document Reviewed: 07/30/2015 Elsevier Interactive Patient Education  [employer].

## 2017-10-19 NOTE — Progress Notes (Signed)
Discharge instructions given to pt. Discussed limitations to activities, signs and symptoms to report to the MD, upcoming appointments, and meds. Pt verbalizes understanding and has no questions or concerns at this time. IV taken out and pt tolerated well. Pt discharged from hospital in stable condition.

## 2017-10-25 ENCOUNTER — Inpatient Hospital Stay (HOSPITAL_BASED_OUTPATIENT_CLINIC_OR_DEPARTMENT_OTHER): Payer: Federal, State, Local not specified - PPO

## 2017-10-25 ENCOUNTER — Encounter (HOSPITAL_COMMUNITY)
Admission: AD | Disposition: A | Discharge status: Home / Self Care | Payer: Self-pay | Source: Home / Self Care | Attending: Obstetrics and Gynecology

## 2017-10-25 ENCOUNTER — Inpatient Hospital Stay (HOSPITAL_COMMUNITY)
Admission: AD | Admit: 2017-10-25 | Discharge: 2017-10-28 | DRG: 788 | Disposition: A | Discharge status: Home / Self Care | Payer: Federal, State, Local not specified - PPO | Attending: Obstetrics and Gynecology | Admitting: Obstetrics and Gynecology

## 2017-10-25 ENCOUNTER — Other Ambulatory Visit: Payer: Self-pay

## 2017-10-25 ENCOUNTER — Encounter (HOSPITAL_COMMUNITY): Payer: Self-pay | Admitting: *Deleted

## 2017-10-25 ENCOUNTER — Inpatient Hospital Stay (HOSPITAL_COMMUNITY): Payer: Federal, State, Local not specified - PPO | Admitting: Anesthesiology

## 2017-10-25 DIAGNOSIS — Z880 Allergy status to penicillin: Secondary | ICD-10-CM

## 2017-10-25 DIAGNOSIS — O4413 Placenta previa with hemorrhage, third trimester: Secondary | ICD-10-CM | POA: Diagnosis not present

## 2017-10-25 DIAGNOSIS — O09523 Supervision of elderly multigravida, third trimester: Secondary | ICD-10-CM

## 2017-10-25 DIAGNOSIS — O321XX0 Maternal care for breech presentation, not applicable or unspecified: Secondary | ICD-10-CM | POA: Diagnosis present

## 2017-10-25 DIAGNOSIS — O4410 Placenta previa with hemorrhage, unspecified trimester: Secondary | ICD-10-CM | POA: Diagnosis present

## 2017-10-25 DIAGNOSIS — O479 False labor, unspecified: Secondary | ICD-10-CM | POA: Diagnosis not present

## 2017-10-25 DIAGNOSIS — O4403 Placenta previa specified as without hemorrhage, third trimester: Secondary | ICD-10-CM | POA: Diagnosis not present

## 2017-10-25 DIAGNOSIS — O4703 False labor before 37 completed weeks of gestation, third trimester: Secondary | ICD-10-CM | POA: Diagnosis not present

## 2017-10-25 DIAGNOSIS — Z3A30 30 weeks gestation of pregnancy: Secondary | ICD-10-CM

## 2017-10-25 DIAGNOSIS — R58 Hemorrhage, not elsewhere classified: Secondary | ICD-10-CM | POA: Diagnosis not present

## 2017-10-25 DIAGNOSIS — R000 Tachycardia, unspecified: Secondary | ICD-10-CM | POA: Diagnosis not present

## 2017-10-25 DIAGNOSIS — O4400 Placenta previa specified as without hemorrhage, unspecified trimester: Secondary | ICD-10-CM

## 2017-10-25 DIAGNOSIS — O200 Threatened abortion: Secondary | ICD-10-CM | POA: Diagnosis not present

## 2017-10-25 DIAGNOSIS — Z98891 History of uterine scar from previous surgery: Secondary | ICD-10-CM

## 2017-10-25 LAB — CBC
HCT: 33.5 % — ABNORMAL LOW (ref 36.0–46.0)
HCT: 31.6 % — ABNORMAL LOW (ref 36.0–46.0)
Hemoglobin: 11 g/dL — ABNORMAL LOW (ref 12.0–15.0)
Hemoglobin: 10.5 g/dL — ABNORMAL LOW (ref 12.0–15.0)
MCH: 29.4 pg (ref 26.0–34.0)
MCH: 29.8 pg (ref 26.0–34.0)
MCHC: 32.8 g/dL (ref 30.0–36.0)
MCHC: 33.2 g/dL (ref 30.0–36.0)
MCV: 89.6 fL (ref 78.0–100.0)
MCV: 89.8 fL (ref 78.0–100.0)
PLATELETS: 171 10*3/uL (ref 150–400)
PLATELETS: 164 10*3/uL (ref 150–400)
RBC: 3.74 MIL/uL — AB (ref 3.87–5.11)
RBC: 3.52 MIL/uL — AB (ref 3.87–5.11)
RDW: 14.7 % (ref 11.5–15.5)
RDW: 14.7 % (ref 11.5–15.5)
WBC: 9.8 10*3/uL (ref 4.0–10.5)
WBC: 8.5 10*3/uL (ref 4.0–10.5)

## 2017-10-25 LAB — PREPARE RBC (CROSSMATCH)

## 2017-10-25 LAB — MAGNESIUM: Magnesium: 3.6 mg/dL — ABNORMAL HIGH (ref 1.7–2.4)

## 2017-10-25 SURGERY — Surgical Case
Anesthesia: Regional

## 2017-10-25 MED ORDER — MAGNESIUM SULFATE 40 G IN LACTATED RINGERS - SIMPLE
2.0000 g/h | INTRAVENOUS | Status: DC
  Filled 2017-10-25: qty 500

## 2017-10-25 MED ORDER — PHENYLEPHRINE 8 MG IN D5W 100 ML (0.08MG/ML) PREMIX OPTIME
INJECTION | INTRAVENOUS | Status: DC | PRN
  Administered 2017-10-25: 60 ug/min via INTRAVENOUS

## 2017-10-25 MED ORDER — SCOPOLAMINE 1 MG/3DAYS TD PT72
MEDICATED_PATCH | TRANSDERMAL | Status: DC | PRN
  Administered 2017-10-25: 1 via TRANSDERMAL

## 2017-10-25 MED ORDER — ONDANSETRON HCL 4 MG/2ML IJ SOLN
INTRAMUSCULAR | Status: AC
  Filled 2017-10-25: qty 2

## 2017-10-25 MED ORDER — SCOPOLAMINE 1 MG/3DAYS TD PT72
MEDICATED_PATCH | TRANSDERMAL | Status: AC
  Filled 2017-10-25: qty 1

## 2017-10-25 MED ORDER — ONDANSETRON HCL 4 MG/2ML IJ SOLN
INTRAMUSCULAR | Status: DC | PRN
  Administered 2017-10-25: 4 mg via INTRAVENOUS

## 2017-10-25 MED ORDER — SOD CITRATE-CITRIC ACID 500-334 MG/5ML PO SOLN
ORAL | Status: AC
  Administered 2017-10-25: 30 mL
  Filled 2017-10-25: qty 15

## 2017-10-25 MED ORDER — SODIUM CHLORIDE 0.9% IV SOLUTION: Freq: Once | INTRAVENOUS | Status: DC

## 2017-10-25 MED ORDER — ZOLPIDEM TARTRATE 5 MG PO TABS: 5.0000 mg | ORAL_TABLET | Freq: Every evening | ORAL | Status: DC | PRN

## 2017-10-25 MED ORDER — PHENYLEPHRINE HCL 10 MG/ML IJ SOLN
INTRAMUSCULAR | Status: DC | PRN
  Administered 2017-10-25 (×2): 80 ug via INTRAVENOUS

## 2017-10-25 MED ORDER — GENTAMICIN SULFATE 40 MG/ML IJ SOLN
INTRAVENOUS | Status: AC
  Administered 2017-10-25: 470 mg via INTRAVENOUS
  Filled 2017-10-25: qty 11.75

## 2017-10-25 MED ORDER — PRENATAL MULTIVITAMIN CH
1.0000 | ORAL_TABLET | Freq: Every day | ORAL | Status: DC
  Administered 2017-10-25: 1 via ORAL
  Filled 2017-10-25: qty 1

## 2017-10-25 MED ORDER — PHENYLEPHRINE 8 MG IN D5W 100 ML (0.08MG/ML) PREMIX OPTIME
INJECTION | INTRAVENOUS | Status: AC
  Filled 2017-10-25: qty 100

## 2017-10-25 MED ORDER — PHENYLEPHRINE 40 MCG/ML (10ML) SYRINGE FOR IV PUSH (FOR BLOOD PRESSURE SUPPORT)
PREFILLED_SYRINGE | INTRAVENOUS | Status: AC
  Filled 2017-10-25: qty 10

## 2017-10-25 MED ORDER — FENTANYL CITRATE (PF) 100 MCG/2ML IJ SOLN
INTRAMUSCULAR | Status: AC
  Filled 2017-10-25: qty 2

## 2017-10-25 MED ORDER — MAGNESIUM SULFATE BOLUS VIA INFUSION
4.0000 g | Freq: Once | INTRAVENOUS | Status: AC
  Administered 2017-10-25: 4 g via INTRAVENOUS
  Filled 2017-10-25: qty 500

## 2017-10-25 MED ORDER — OXYTOCIN 10 UNIT/ML IJ SOLN
INTRAMUSCULAR | Status: AC
  Filled 2017-10-25: qty 4

## 2017-10-25 MED ORDER — DOCUSATE SODIUM 100 MG PO CAPS
100.0000 mg | ORAL_CAPSULE | Freq: Every day | ORAL | Status: DC
  Administered 2017-10-25: 100 mg via ORAL
  Filled 2017-10-25: qty 1

## 2017-10-25 MED ORDER — FENTANYL CITRATE (PF) 100 MCG/2ML IJ SOLN
INTRAMUSCULAR | Status: DC | PRN
  Administered 2017-10-25: 15 ug via INTRATHECAL

## 2017-10-25 MED ORDER — MORPHINE SULFATE (PF) 0.5 MG/ML IJ SOLN
INTRAMUSCULAR | Status: DC | PRN
  Administered 2017-10-25: .15 mg via INTRATHECAL

## 2017-10-25 MED ORDER — INFLUENZA VAC SPLIT QUAD 0.5 ML IM SUSY: 0.5000 mL | PREFILLED_SYRINGE | INTRAMUSCULAR | Status: DC

## 2017-10-25 MED ORDER — CALCIUM CARBONATE ANTACID 500 MG PO CHEW: 2.0000 | CHEWABLE_TABLET | ORAL | Status: DC | PRN

## 2017-10-25 MED ORDER — LACTATED RINGERS IV SOLN
INTRAVENOUS | Status: DC | PRN
  Administered 2017-10-25: 23:00:00 via INTRAVENOUS

## 2017-10-25 MED ORDER — BUPIVACAINE IN DEXTROSE 0.75-8.25 % IT SOLN
INTRATHECAL | Status: DC | PRN
  Administered 2017-10-25: 1.6 mL via INTRATHECAL

## 2017-10-25 MED ORDER — DEXAMETHASONE SODIUM PHOSPHATE 10 MG/ML IJ SOLN
INTRAMUSCULAR | Status: AC
  Filled 2017-10-25: qty 1

## 2017-10-25 MED ORDER — LACTATED RINGERS IV SOLN
INTRAVENOUS | Status: DC
  Administered 2017-10-25 (×3): via INTRAVENOUS

## 2017-10-25 MED ORDER — MORPHINE SULFATE (PF) 0.5 MG/ML IJ SOLN
INTRAMUSCULAR | Status: AC
  Filled 2017-10-25: qty 10

## 2017-10-25 MED ORDER — OXYTOCIN 10 UNIT/ML IJ SOLN
INTRAMUSCULAR | Status: DC | PRN
  Administered 2017-10-25: 40 [IU] via INTRAMUSCULAR

## 2017-10-25 MED ORDER — DEXAMETHASONE SODIUM PHOSPHATE 10 MG/ML IJ SOLN
INTRAMUSCULAR | Status: DC | PRN
  Administered 2017-10-25: 10 mg via INTRAVENOUS

## 2017-10-25 SURGICAL SUPPLY — 40 items
BENZOIN TINCTURE PRP APPL 2/3 (GAUZE/BANDAGES/DRESSINGS) ×2 IMPLANT
CHLORAPREP W/TINT 26ML (MISCELLANEOUS) ×2 IMPLANT
CLAMP CORD UMBIL (MISCELLANEOUS) IMPLANT
CLOTH BEACON ORANGE TIMEOUT ST (SAFETY) ×2 IMPLANT
CLSR STERI-STRIP ANTIMIC 1/2X4 (GAUZE/BANDAGES/DRESSINGS) ×2 IMPLANT
DERMABOND ADVANCED (GAUZE/BANDAGES/DRESSINGS)
DERMABOND ADVANCED .7 DNX12 (GAUZE/BANDAGES/DRESSINGS) IMPLANT
DRSG OPSITE POSTOP 4X10 (GAUZE/BANDAGES/DRESSINGS) ×2 IMPLANT
ELECT REM PT RETURN 9FT ADLT (ELECTROSURGICAL) ×2
ELECTRODE REM PT RTRN 9FT ADLT (ELECTROSURGICAL) ×1 IMPLANT
EXTRACTOR VACUUM KIWI (MISCELLANEOUS) IMPLANT
GAUZE SPONGE 4X4 12PLY STRL LF (GAUZE/BANDAGES/DRESSINGS) ×2 IMPLANT
GLOVE BIO SURGEON STRL SZ 6 (GLOVE) ×2 IMPLANT
GLOVE BIOGEL PI IND STRL 6 (GLOVE) ×2 IMPLANT
GLOVE BIOGEL PI IND STRL 7.0 (GLOVE) ×1 IMPLANT
GLOVE BIOGEL PI INDICATOR 6 (GLOVE) ×2
GLOVE BIOGEL PI INDICATOR 7.0 (GLOVE) ×1
GOWN STRL REUS W/TWL LRG LVL3 (GOWN DISPOSABLE) ×4 IMPLANT
KIT ABG SYR 3ML LUER SLIP (SYRINGE) ×4 IMPLANT
NDL SAFETY ECLIPSE 18X1.5 (NEEDLE) ×1 IMPLANT
NEEDLE HYPO 18GX1.5 SHARP (NEEDLE) ×1
NEEDLE HYPO 25X5/8 SAFETYGLIDE (NEEDLE) ×6 IMPLANT
NS IRRIG 1000ML POUR BTL (IV SOLUTION) ×2 IMPLANT
PACK C SECTION WH (CUSTOM PROCEDURE TRAY) ×2 IMPLANT
PAD ABD 7.5X8 STRL (GAUZE/BANDAGES/DRESSINGS) ×2 IMPLANT
PAD OB MATERNITY 4.3X12.25 (PERSONAL CARE ITEMS) ×2 IMPLANT
PENCIL SMOKE EVAC W/HOLSTER (ELECTROSURGICAL) ×2 IMPLANT
SPONGE LAP 18X18 RF (DISPOSABLE) ×6 IMPLANT
STRIP CLOSURE SKIN 1/2X4 (GAUZE/BANDAGES/DRESSINGS) IMPLANT
SUT CHROMIC 0 CTX 36 (SUTURE) ×6 IMPLANT
SUT CHROMIC 3 0 SH 27 (SUTURE) ×2 IMPLANT
SUT MON AB 2-0 CT1 27 (SUTURE) ×2 IMPLANT
SUT PDS AB 0 CT1 27 (SUTURE) IMPLANT
SUT PLAIN 0 NONE (SUTURE) IMPLANT
SUT PLAIN 2 0 (SUTURE) ×1
SUT PLAIN ABS 2-0 CT1 27XMFL (SUTURE) ×1 IMPLANT
SUT VIC AB 0 CT1 36 (SUTURE) IMPLANT
SUT VIC AB 4-0 KS 27 (SUTURE) IMPLANT
TOWEL OR 17X24 6PK STRL BLUE (TOWEL DISPOSABLE) ×2 IMPLANT
TRAY FOLEY W/BAG SLVR 14FR LF (SET/KITS/TRAYS/PACK) IMPLANT

## 2017-10-25 NOTE — Progress Notes (Signed)
2010 -  Pt's husband came to nurses station requesting for MD to be called because pt is bleeding. Followed him to pt's rm, found pt in bed with scant bleeding. Moderate amt of bleeding with small blood clot found in bedside comoude. Pt, her mother and husband very anxious and asking to ses MD as soon as possible. Told pt MD is in the OR at this time. Assured pt and family FHR is reactive and bleeding is not unusual with placenta previa.  Will continue to monitor

## 2017-10-25 NOTE — MAU Note (Signed)
Pt presents via EMS with c/o VB that began @ 0650 this am.  Reports has known Hx of placenta previa with this pregnancy.  Also reports this is 2nd major bleed.  Was hospitalized last week x4 days for 1st bleed.   Report +FM and mild period type cramps.

## 2017-10-25 NOTE — H&P (Signed)
Kelly Keller is a 37 y.o. female presenting for known anterior placenta previa. Admitted last week with bleeding and uterine irritability. Received betamethasone x 2 and IV magnesium sulfate x 15 hours. D/C home after about 3 days at patient request. Awoke with bleeding and contractions. Bleeding into toilet. Contractions continue. Arrived via squad. No gush of fluid. OB History    Gravida  3   Para  2   Term  2   Preterm      AB      Living  2     SAB      TAB      Ectopic      Multiple      Live Births  2          Past Medical History:  Diagnosis Date  . Medical history non-contributory    Past Surgical History:  Procedure Laterality Date  . CHOLECYSTECTOMY    . WISDOM TOOTH EXTRACTION Bilateral    Family History: family history includes Heart attack in her mother. Social History:  reports that she has never smoked. She has never used smokeless tobacco. She reports that she does not drink alcohol or use drugs.     Maternal Diabetes: No Genetic Screening: Normal Maternal Ultrasounds/Referrals: Abnormal:  Findings:   Other:placenta previa Fetal Ultrasounds or other Referrals:  None Maternal Substance Abuse:  No Significant Maternal Medications:  None Significant Maternal Lab Results:  None Other Comments:  None  Review of Systems  Constitutional: Negative for fever.  Eyes: Negative for blurred vision.  Neurological: Negative for headaches.   Maternal Medical History:  Reason for admission: Contractions and vaginal bleeding.   Contractions: Onset was 3-5 hours ago.    Fetal activity: Perceived fetal activity is normal.        Blood pressure 135/78, pulse (!) 111, temperature 99.5 F (37.5 C), temperature source Oral, resp. rate 20. Maternal Exam:  Uterine Assessment: Contraction strength is firm.  Contraction frequency is irregular.   Abdomen: Patient reports no abdominal tenderness.   Fetal Exam Fetal State Assessment: Category I -  tracings are normal.     Physical Exam  Cardiovascular: Normal rate and regular rhythm.  Respiratory: Effort normal and breath sounds normal.  GI: Soft. There is no tenderness.  Neurological: She has normal reflexes.   had saturated large pad upon arrival Now staining of pad, no flow of blood Uterus soft, NT Firm irregular UC palpatted  Prenatal labs: ABO, Rh: --/--/O POS (09/27 0900) Antibody: PENDING (09/27 0900) Rubella:   RPR:    HBsAg:    HIV:    GBS:    Results for orders placed or performed during the hospital encounter of 10/25/17 (from the past 24 hour(s))  CBC on admission     Status: Abnormal   Collection Time: 10/25/17  9:00 AM  Result Value Ref Range   WBC 9.8 4.0 - 10.5 K/uL   RBC 3.74 (L) 3.87 - 5.11 MIL/uL   Hemoglobin 11.0 (L) 12.0 - 15.0 g/dL   HCT 33.5 (L) 36.0 - 46.0 %   MCV 89.6 78.0 - 100.0 fL   MCH 29.4 26.0 - 34.0 pg   MCHC 32.8 30.0 - 36.0 g/dL   RDW 14.7 11.5 - 15.5 %   Platelets 171 150 - 400 K/uL  Type and screen [place]     Status: None (Preliminary result)   Collection Time: 10/25/17  9:00 AM  Result Value Ref Range   ABO/RH(D) Jenetta Downer  POS    Antibody Screen PENDING    Sample Expiration      10/28/2017 Performed at [facility], [address] [place],  [address]   Assessment/Plan: D/W Dr Bonnell Public 37 yo G3P2 @ 30 2/7 weeks Placenta previa with bleed #2, now stable Uterine contractions D/W patient and family-plan>while under 32 weeks will start magnesium sulfate and observe closely. If recurrent large bleeding episode will proceed with cesarean section.  D/W patient cesarean section and risks including infection, organ damage, bleeding/transfusion-HIV/Hep, DVT/PE, pneumonia, wound break down, return to OR, hysterectomy. Patient states she understands and agrees. T&C x 2 units PRBC   Shon Millet II 10/25/2017, 10:04 AM

## 2017-10-25 NOTE — Anesthesia Preprocedure Evaluation (Signed)
Anesthesia Evaluation  Patient identified by MRN, date of birth, ID band Patient awake    Reviewed: Allergy & Precautions, H&P , NPO status , Patient's Chart, lab work & pertinent test results, reviewed documented beta blocker date and time   Airway Mallampati: II  TM Distance: >3 FB Neck ROM: full    Dental no notable dental hx.    Pulmonary neg pulmonary ROS,    Pulmonary exam normal breath sounds clear to auscultation       Cardiovascular negative cardio ROS Normal cardiovascular exam Rhythm:regular Rate:Normal     Neuro/Psych negative neurological ROS  negative psych ROS   GI/Hepatic negative GI ROS, Neg liver ROS,   Endo/Other  negative endocrine ROS  Renal/GU negative Renal ROS  negative genitourinary   Musculoskeletal   Abdominal   Peds  Hematology negative hematology ROS (+)   Anesthesia Other Findings   Reproductive/Obstetrics (+) Pregnancy                             Anesthesia Physical Anesthesia Plan  ASA: III and emergent  Anesthesia Plan: Spinal   Post-op Pain Management:    Induction:   PONV Risk Score and Plan: 2 and Treatment may vary due to age or medical condition  Airway Management Planned: Nasal Cannula  Additional Equipment:   Intra-op Plan:   Post-operative Plan:   Informed Consent: I have reviewed the patients History and Physical, chart, labs and discussed the procedure including the risks, benefits and alternatives for the proposed anesthesia with the patient or authorized representative who has indicated his/her understanding and acceptance.     Plan Discussed with: Anesthesiologist, CRNA and Surgeon  Anesthesia Plan Comments:         Anesthesia Quick Evaluation

## 2017-10-25 NOTE — Progress Notes (Signed)
Feeling UC q12-15 min Scant spot of red on tissue last trip to toilet  Vitals:   10/25/17 1242 10/25/17 1300  BP:  121/65  Pulse:  (!) 115  Resp:  16  Temp: 98.5 F (36.9 C)   SpO2:     Lungs CTA Cor RRR DTR1+  FHT cat 1 UCs irregular  A/P: Stable         Continue NPO/magnesium         Magnesium level now

## 2017-10-25 NOTE — Anesthesia Procedure Notes (Signed)
Spinal  Patient location during procedure: OR Start time: 10/25/2017 10:45 PM End time: 10/25/2017 10:50 PM Staffing Anesthesiologist: Janeece Riggers, MD Preanesthetic Checklist Completed: patient identified, site marked, surgical consent, pre-op evaluation, timeout performed, IV checked, risks and benefits discussed and monitors and equipment checked Spinal Block Patient position: sitting Prep: DuraPrep Patient monitoring: heart rate, cardiac monitor, continuous pulse ox and blood pressure Approach: midline Location: L3-4 Injection technique: single-shot Needle Needle type: Sprotte  Needle gauge: 24 G Needle length: 9 cm Assessment Sensory level: T4

## 2017-10-25 NOTE — Progress Notes (Signed)
Will advance to clear liquid diet

## 2017-10-25 NOTE — Progress Notes (Signed)
IV to left AC intact, started by EMS, site WNL, flushed with 5cc NS

## 2017-10-25 NOTE — Progress Notes (Signed)
Patient C/O increasing UCs. About 1 hour ago passed fist sized clot and some blood. This is bigger than her other two episodes of bleeding.  Vitals:   10/25/17 1901 10/25/17 1933  BP: 112/65 122/79  Pulse: (!) 101 (!) 103  Resp: 16 17  Temp:  98.6 F (37 C)  SpO2:  98%   Uterus - moderate UC palpated, uterus soft between UCs No blood at perineum  FHT cat one  A/P: placenta previa with increasingly heavy bleeding         D/W patient>recommend cesarean section.         Reviewed risks including infection, organ damage, bleeding/transfusion-HIIV/Hep, DVT/PE, pneumonia, return to OR. Also D/W prematurity of baby.          Patient states she understands and agrees.

## 2017-10-25 NOTE — MAU Provider Note (Signed)
  History     CSN: [id number]  Arrival date and time: 10/25/17 0754   First Provider Initiated Contact with Patient 10/25/17 0805      Chief Complaint  Patient presents with  . Vaginal Bleeding   Kelly Keller is a 37 y.o. G3P2002 at [redacted]w[redacted]d with known previa that presents today with vaginal bleeding. She was admitted here for 4 days with bleeding, and was dc home on 10/19/17. She states that she has had not had bleeding until tonight. She awoke in the night, and was cramping from an orgasm in her sleep. The bleeding started right after. She denies any contractions at this time. She denies any leaking of fluid. She reports normal fetal movement.    OB History    Gravida  3   Para  2   Term  2   Preterm      AB      Living  2     SAB      TAB      Ectopic      Multiple      Live Births  2           Past Medical History:  Diagnosis Date  . Medical history non-contributory     Past Surgical History:  Procedure Laterality Date  . CHOLECYSTECTOMY    . WISDOM TOOTH EXTRACTION Bilateral     Family History  Problem Relation Age of Onset  . Heart attack Mother     Social History   Tobacco Use  . Smoking status: Never Smoker  . Smokeless tobacco: Never Used  Substance Use Topics  . Alcohol use: Never    Frequency: Never  . Drug use: Never    Allergies:  Allergies  Allergen Reactions  . Penicillins Rash  . Tylenol With Codeine #3 [Acetaminophen-Codeine] Rash    Medications Prior to Admission  Medication Sig Dispense Refill Last Dose  . Prenatal Vit-Fe Fumarate-FA (MULTIVITAMIN-PRENATAL) 27-0.8 MG TABS tablet Take 1 tablet by mouth daily at 12 noon.   10/15/2017 at Unknown time    Review of Systems Physical Exam   Blood pressure (!) 155/80, pulse (!) 117, resp. rate 18.  Physical Exam  Nursing note and vitals reviewed. Constitutional: She is oriented to person, place, and time. She appears well-developed and well-nourished. No distress.   HENT:  Head: Normocephalic.  Cardiovascular: Normal rate.  Respiratory: Effort normal.  GI: Soft. There is no tenderness. There is no rebound.  Genitourinary:  Genitourinary Comments: Small amount of bright red blood on pad   Neurological: She is alert and oriented to person, place, and time.  Skin: Skin is warm and dry.  Psychiatric: She has a normal mood and affect.   NST:  Baseline: 140 Variability: moderate Accels: 15x15 Decels: none Toco: none    MAU Course  Procedures  MDM 8:24 AM Consult with Kelly Keller. Admit to ante. type and cross match, bedrest, ultrasound, IV fluids, NPO  Assessment and Plan   1. Complete placenta previa with hemorrhage, third trimester   2. Antepartum hemorrhage from placenta previa    Admit to antenatal   Kelly Keller 10/25/2017, 8:06 AM

## 2017-10-25 NOTE — Brief Op Note (Signed)
10/25/2017  11:52 PM  PATIENT:  Lovenia Shuck  37 y.o. female  PRE-OPERATIVE DIAGNOSIS:  primary cesarean section due to complete previa  POST-OPERATIVE DIAGNOSIS:  primary cesarean section due to complete previa  PROCEDURE:  Primary low transverse cesarean section  SURGEON:  Surgeon(s) and Role:    * Everlene Farrier, MD - Primary  PHYSICIAN ASSISTANT:   ASSISTANTS: none   ANESTHESIA:   spinal  EBL:  400 mL   BLOOD ADMINISTERED:none  DRAINS: Urinary Catheter (Foley)   LOCAL MEDICATIONS USED:  NONE  SPECIMEN:  Source of Specimen:  placenta  DISPOSITION OF SPECIMEN:  PATHOLOGY  COUNTS:  YES  TOURNIQUET:  * No tourniquets in log *  DICTATION: .Other Dictation: Dictation Number [id number]  PLAN OF CARE: Admit to inpatient   PATIENT DISPOSITION:  PACU - hemodynamically stable.   Delay start of Pharmacological VTE agent (>24hrs) due to surgical blood loss or risk of bleeding: yes

## 2017-10-26 ENCOUNTER — Encounter (HOSPITAL_COMMUNITY): Payer: Self-pay | Admitting: *Deleted

## 2017-10-26 LAB — CBC
HCT: 28.5 % — ABNORMAL LOW (ref 36.0–46.0)
HEMOGLOBIN: 9.7 g/dL — AB (ref 12.0–15.0)
MCH: 30.5 pg (ref 26.0–34.0)
MCHC: 34 g/dL (ref 30.0–36.0)
MCV: 89.6 fL (ref 78.0–100.0)
Platelets: 158 10*3/uL (ref 150–400)
RBC: 3.18 MIL/uL — ABNORMAL LOW (ref 3.87–5.11)
RDW: 14.6 % (ref 11.5–15.5)
WBC: 12.9 10*3/uL — ABNORMAL HIGH (ref 4.0–10.5)

## 2017-10-26 MED ORDER — ACETAMINOPHEN 325 MG PO TABS: 325.0000 mg | ORAL_TABLET | ORAL | Status: DC | PRN

## 2017-10-26 MED ORDER — DIBUCAINE 1 % RE OINT: 1.0000 "application " | TOPICAL_OINTMENT | RECTAL | Status: DC | PRN

## 2017-10-26 MED ORDER — SODIUM CHLORIDE 0.9 % IR SOLN
Status: DC | PRN
  Administered 2017-10-26: 1

## 2017-10-26 MED ORDER — OXYTOCIN 40 UNITS IN LACTATED RINGERS INFUSION - SIMPLE MED: 2.5000 [IU]/h | INTRAVENOUS | Status: AC

## 2017-10-26 MED ORDER — IBUPROFEN 600 MG PO TABS
600.0000 mg | ORAL_TABLET | Freq: Four times a day (QID) | ORAL | Status: DC
  Administered 2017-10-26 – 2017-10-28 (×8): 600 mg via ORAL
  Filled 2017-10-26 (×10): qty 1

## 2017-10-26 MED ORDER — ZOLPIDEM TARTRATE 5 MG PO TABS: 5.0000 mg | ORAL_TABLET | Freq: Every evening | ORAL | Status: DC | PRN

## 2017-10-26 MED ORDER — FENTANYL CITRATE (PF) 100 MCG/2ML IJ SOLN: 25.0000 ug | INTRAMUSCULAR | Status: DC | PRN

## 2017-10-26 MED ORDER — SIMETHICONE 80 MG PO CHEW
80.0000 mg | CHEWABLE_TABLET | Freq: Three times a day (TID) | ORAL | Status: DC
  Administered 2017-10-26 – 2017-10-28 (×6): 80 mg via ORAL
  Filled 2017-10-26 (×8): qty 1

## 2017-10-26 MED ORDER — PRENATAL MULTIVITAMIN CH
1.0000 | ORAL_TABLET | Freq: Every day | ORAL | Status: DC
  Administered 2017-10-26 – 2017-10-28 (×3): 1 via ORAL
  Filled 2017-10-26 (×2): qty 1

## 2017-10-26 MED ORDER — ACETAMINOPHEN 160 MG/5ML PO SOLN: 325.0000 mg | ORAL | Status: DC | PRN

## 2017-10-26 MED ORDER — TETANUS-DIPHTH-ACELL PERTUSSIS 5-2.5-18.5 LF-MCG/0.5 IM SUSP: 0.5000 mL | Freq: Once | INTRAMUSCULAR | Status: DC

## 2017-10-26 MED ORDER — ONDANSETRON HCL 4 MG/2ML IJ SOLN: 4.0000 mg | Freq: Once | INTRAMUSCULAR | Status: DC | PRN

## 2017-10-26 MED ORDER — WITCH HAZEL-GLYCERIN EX PADS: 1.0000 "application " | MEDICATED_PAD | CUTANEOUS | Status: DC | PRN

## 2017-10-26 MED ORDER — LACTATED RINGERS IV SOLN: INTRAVENOUS | Status: DC

## 2017-10-26 MED ORDER — OXYCODONE HCL 5 MG PO TABS: 5.0000 mg | ORAL_TABLET | Freq: Once | ORAL | Status: DC | PRN

## 2017-10-26 MED ORDER — TRAMADOL HCL 50 MG PO TABS
50.0000 mg | ORAL_TABLET | Freq: Four times a day (QID) | ORAL | Status: DC | PRN
  Administered 2017-10-27: 50 mg via ORAL
  Filled 2017-10-26: qty 1

## 2017-10-26 MED ORDER — MENTHOL 3 MG MT LOZG: 1.0000 | LOZENGE | OROMUCOSAL | Status: DC | PRN

## 2017-10-26 MED ORDER — SIMETHICONE 80 MG PO CHEW
80.0000 mg | CHEWABLE_TABLET | ORAL | Status: DC | PRN
  Administered 2017-10-26: 80 mg via ORAL

## 2017-10-26 MED ORDER — SIMETHICONE 80 MG PO CHEW
80.0000 mg | CHEWABLE_TABLET | ORAL | Status: DC
  Administered 2017-10-27 (×2): 80 mg via ORAL
  Filled 2017-10-26 (×2): qty 1

## 2017-10-26 MED ORDER — OXYCODONE HCL 5 MG/5ML PO SOLN: 5.0000 mg | Freq: Once | ORAL | Status: DC | PRN

## 2017-10-26 MED ORDER — COCONUT OIL OIL
1.0000 "application " | TOPICAL_OIL | Status: DC | PRN
  Administered 2017-10-27: 1 via TOPICAL
  Filled 2017-10-26: qty 120

## 2017-10-26 MED ORDER — ACETAMINOPHEN 325 MG PO TABS: 650.0000 mg | ORAL_TABLET | ORAL | Status: DC | PRN

## 2017-10-26 MED ORDER — SENNOSIDES-DOCUSATE SODIUM 8.6-50 MG PO TABS
2.0000 | ORAL_TABLET | ORAL | Status: DC
  Administered 2017-10-27 (×2): 2 via ORAL
  Filled 2017-10-26 (×2): qty 2

## 2017-10-26 MED ORDER — MEPERIDINE HCL 25 MG/ML IJ SOLN: 6.2500 mg | INTRAMUSCULAR | Status: DC | PRN

## 2017-10-26 MED ORDER — DIPHENHYDRAMINE HCL 25 MG PO CAPS: 25.0000 mg | ORAL_CAPSULE | Freq: Four times a day (QID) | ORAL | Status: DC | PRN

## 2017-10-26 NOTE — Op Note (Signed)
Kelly Keller, MCCLENAHAN MEDICAL RECORD IF:[id number] ACCOUNT [id number] DATE OF BIRTH:05/15/1980 FACILITY: [facility] LOCATION: [place] II, MD  OPERATIVE REPORT  DATE OF PROCEDURE:  10/25/2017  PREOPERATIVE DIAGNOSES: 1.  Intrauterine pregnancy at 30 and 2/7 weeks. 2.  Placenta previa, bleeding.  POSTOPERATIVE DIAGNOSES: 1.  Intrauterine pregnancy at 30 and 2/7 weeks. 2.  Placenta previa, bleeding.  PROCEDURE:  Primary low transverse cesarean section.  SURGEON:  Everlene Farrier, II M.D.  ANESTHESIA:  Spinal,  Tiana Loft, MD  ESTIMATED BLOOD LOSS:  450 mL.  SPECIMENS:  Placenta to pathology.  FINDINGS:  Viable female infant.  Apgars, arterial cord pH, birth weight pending.  INDICATIONS AND CONSENT:  This patient is a 37 year old G3 P2 with a known anterior placenta previa.  She has had progressively heavier episodes of bleeding and contractions.  She received 2 doses of betamethasone approximately 1 week ago and has been on  magnesium sulfate for approximately 12 hours.  Because of the continued and progressive bleeding, recommendation for delivery was made.  Potential risks, complications are reviewed preoperatively including but not limited to infection, organ damage,  bleeding requiring transfusion of blood products with possible HIV and hepatitis acquisition, DVT, PE, and pneumonia.  The patient states she understands and agrees and consent signed on the chart.  DESCRIPTION OF PROCEDURE:  The patient was taken to the operating room where she was identified.  Spinal anesthetic was placed per Dr. Ambrose Pancoast.  She was placed in the dorsal supine position with a 15 degree left lateral wedge.  She was prepped vaginally  with Betadine.  Foley catheter was placed and abdominally with ChloraPrep.  After 3 minute drying time, she was draped in sterile fashion.  Timeout was undertaken.  After testing for adequate spinal anesthesia, skin was entered through a Pfannenstiel   incision and dissection was carried out in layers to the peritoneum.  Peritoneum was entered and extended superiorly and inferiorly.  The vesicouterine peritoneum was seen to be well away from the uterine incision.  Therefore, the uterus was incised in a  low transverse manner and the uterus was entered bluntly with a hemostat.  This enters the superior portion of the anterior placenta previa.  A window to the amniotic fluid is found on the margin of the placenta and to the left aspect of this.  This was ruptured and allowed the delivering hand to enter the uterus.  The baby was transverse position.  The breech is brought down without difficulty and the baby was delivered in a frank breech position without difficulty.  Umbilical cord was clamped and cut and the baby was  handed to waiting pediatrics team.  Placenta was manually delivered and sent to pathology.  Uterus was exteriorized and inspected and the cavity was clean.  Uterus was closed in 2 running locking imbricating layers of 0 Monocryl suture; 2-0 Chromic was  also used to assure complete hemostasis.  Tubes and ovaries are normal.  Uterus was returned to the abdomen.  Copious lavage was carried out and all returned as clear.  The anterior peritoneum was closed in a running fashion with 0 Monocryl suture, which  was also used to reapproximate the pyramidalis muscle in the midline.  Anterior rectus fascia was closed in a running fashion with a 0 looped PDS suture.  Subcutaneous layer was closed with interrupted plain and the skin was closed in a subcuticular  fashion with 4-0 Vicryl on a Keith needle.  Benzoin, Steri-Strips, honeycomb dressing and  pressure dressing applied.    All counts were correct and the patient was transferred to the PACU in stable condition.  AN/NUANCE  D:10/25/2017 T:10/26/2017 JOB:[id number]

## 2017-10-26 NOTE — Lactation Note (Signed)
This note was copied from a baby's chart. Lactation Consultation Note  Patient Name: Kelly Keller Date: 10/26/2017 Reason for consult: Initial assessment;Preterm <34wks;NICU baby;Infant < 6lbs  Visited with a mom of 13 hours old premature NICU baby < 4 lbs; mom is a P3 and experienced BF. She was able to BF her first child for 1 month (she had trouble with her left nipple, it was inverted) and BF her last one for 11 months. Mom was pumping when entering the room, per mom she no longer has inverted nipples, hard to tell at the end of pumping session because she just finished pumping, but both of her nipples looked everted like she stated they normally do.  Mom has 2 DEBP at home, one of them is Medela; she's familiar with hand expression. Her RN Abby set up a DEBP this morning and wrote a pumping log on her whiteboard, she was very proactive. Mom has been following the plan and already pumped twice today, at 9 am and at 11 am. She's very excited of seeing some drops of colostrum already especially since baby was born at 65 2/7 weeks. Discussed lactogenesis II in premature babies and the importance of consistent pumping.  She also voiced that she has accessory breast tissue on her armpit that got engorged in the past, she's had an ultrasound done before that confirmed the findings. 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   Feeding Plan:  1. Mom will continue pumping every 2-3 hours during the day and at least once at night, a minimum of 6-8 pumping sessions in 24 hours. Mom is aware the she can't go more than 6 hours without pumping throughout the night and that she also needs to get some sleep when she can.  2. Mom will be turning any amount of EBM to her RN to be taken to her NICU baby, she's aware of shorter storage guidelines times for NICU babies.  BF brochure, BF resources, pumping log and milk storage guidelines for NICU  babies were discussed, both parents are aware of [facility] services and will call PRN.   Maternal Data Formula Feeding for Exclusion: No Has patient been taught Hand Expression?: Yes Does the patient have breastfeeding experience prior to this delivery?: Yes  Feeding   Interventions Interventions: Breast feeding basics reviewed;DEBP;Expressed milk  Lactation Tools Discussed/Used Tools: Pump Breast pump type: Double-Electric Breast Pump WIC Program: No Pump Review: Setup, frequency, and cleaning;Milk Storage Initiated by:: RN Date initiated:: 10/26/17   Consult Status Consult Status: Follow-up Date: 10/27/17 Follow-up type: In-patient    Kelly Keller 10/26/2017, 11:31 AM

## 2017-10-26 NOTE — Progress Notes (Signed)
Patient in NICU with baby  Vitals:   10/26/17 0502 10/26/17 0728  BP: 114/65 129/69  Pulse: 82 79  Resp: 17 18  Temp: 98.4 F (36.9 C) 98.5 F (36.9 C)  SpO2: 98% 99%

## 2017-10-26 NOTE — Progress Notes (Signed)
Subjective: Postpartum Day 1: Cesarean Delivery Patient reports tolerating PO.    Objective: Vital signs in last 24 hours: Temp:  [97.8 F (36.6 C)-98.6 F (37 C)] 98.5 F (36.9 C) (09/28 0728) Pulse Rate:  [79-115] 79 (09/28 0728) Resp:  [12-26] 18 (09/28 0728) BP: (85-140)/(48-85) 129/69 (09/28 0728) SpO2:  [97 %-100 %] 99 % (09/28 0728) Weight:  [94.3 kg] 94.3 kg (09/27 1400)  Physical Exam:  General: alert, cooperative and no distress Lochia: appropriate Uterine Fundus: firm Incision: healing well DVT Evaluation: No evidence of DVT seen on physical exam. Abdomen soft, mild distension, +some BS  Recent Labs    10/25/17 0900 10/25/17 2231  HGB 11.0* 10.5*  HCT 33.5* 31.6*    Assessment/Plan: Status post Cesarean section. Doing well postoperatively.  Continue current care.  Shon Millet II 10/26/2017, 10:43 AM

## 2017-10-26 NOTE — Transfer of Care (Signed)
Immediate Anesthesia Transfer of Care Note  Patient: Kelly Keller  Procedure(s) Performed: CESAREAN SECTION (N/A )  Patient Location: PACU  Anesthesia Type:Spinal  Level of Consciousness: awake, alert , oriented and patient cooperative  Airway & Oxygen Therapy: Patient Spontanous Breathing  Post-op Assessment: Report given to RN and Post -op Vital signs reviewed and stable  Post vital signs: Reviewed and stable  Last Vitals:  Vitals Value Taken Time  BP    Temp    Pulse    Resp    SpO2      Last Pain:  Vitals:   10/25/17 2039  TempSrc:   PainSc: 1       Patients Stated Pain Goal: 4 (44/51/46 0479)  Complications: No apparent anesthesia complications

## 2017-10-26 NOTE — Anesthesia Postprocedure Evaluation (Signed)
Anesthesia Post Note  Patient: Kelly Keller  Procedure(s) Performed: CESAREAN SECTION (N/A )     Patient location during evaluation: [facility] Anesthesia Type: Spinal Level of consciousness: oriented and awake and alert Pain management: pain level controlled Vital Signs Assessment: post-procedure vital signs reviewed and stable Respiratory status: spontaneous breathing, respiratory function stable and patient connected to nasal cannula oxygen Cardiovascular status: blood pressure returned to baseline and stable Postop Assessment: no headache, no backache and no apparent nausea or vomiting Anesthetic complications: no    Last Vitals:  Vitals:   10/26/17 0502 10/26/17 0728  BP: 114/65 129/69  Pulse: 82 79  Resp: 17 18  Temp: 36.9 C 36.9 C  SpO2: 98% 99%    Last Pain:  Vitals:   10/26/17 0728  TempSrc: Oral  PainSc:    Pain Goal: Patients Stated Pain Goal: 4 (10/25/17 2039)               Rayvon Char

## 2017-10-27 NOTE — Progress Notes (Signed)
10/26/17 in AM pixis room 1 vial of Morphine and Fentanyl found. Dose in pixis left was 4.85mg  Morphine and 13mcg of fentanyl. Transcript from pharmacy revealed pulled by [facility].  Wasted by Rayvon Char, CRNA and CE Sterling,CRNA at 7:15am on 10/27/17,

## 2017-10-27 NOTE — Lactation Note (Signed)
This note was copied from a baby's chart. Lactation Consultation Note  Patient Name: Kelly Keller [id number] Date: 10/27/2017 Reason for consult: Follow-up assessment;NICU baby;Preterm <34wks   P3, Baby [redacted]w[redacted]d in NICU. Mother excited that she received some volume with pumping this morning. Reviewed hand expression with drops expressed and discussed hands on pumping. Discussed milk storage, labeling and pumping rooms.    Maternal Data Has patient been taught Hand Expression?: Yes  Feeding    LATCH Score                   Interventions    Lactation Tools Discussed/Used     Consult Status Consult Status: Follow-up Date: 10/28/17 Follow-up type: In-patient    Vivianne Master [facility] 10/27/2017, 12:23 PM

## 2017-10-27 NOTE — Plan of Care (Signed)
Pt visiting infant in NICU, currently breast pumping every 3 hours. VSS. Pt encouraged to increase ambulation after noted in [facility] going to NICU. Pt tolerating regular diet, passing gas and abdominal binder per pt request.

## 2017-10-27 NOTE — Progress Notes (Signed)
Subjective: Postpartum Day 2: Cesarean Delivery Patient reports incisional pain, tolerating PO, + flatus and no problems voiding.    Objective: Vital signs in last 24 hours: Temp:  [97.4 F (36.3 C)-98.9 F (37.2 C)] 97.4 F (36.3 C) (09/29 0845) Pulse Rate:  [68-86] 86 (09/29 0845) Resp:  [16-18] 16 (09/29 0845) BP: (96-126)/(52-76) 126/75 (09/29 0845) SpO2:  [99 %-100 %] 100 % (09/29 0845)  Physical Exam:  General: alert, cooperative and no distress Lochia: appropriate Uterine Fundus: firm Incision: healing well DVT Evaluation: No evidence of DVT seen on physical exam.  Recent Labs    10/25/17 2231 10/26/17 1302  HGB 10.5* 9.7*  HCT 31.6* 28.5*    Assessment/Plan: Status post Cesarean section. Doing well postoperatively.  Continue current care.  Shon Millet II 10/27/2017, 9:42 AM

## 2017-10-28 LAB — BPAM RBC
BLOOD PRODUCT EXPIRATION DATE: 201910242359
BLOOD PRODUCT EXPIRATION DATE: 201910242359
Blood Product Expiration Date: 201910232359
Blood Product Expiration Date: 201910232359
ISSUE DATE / TIME: 201909271928
UNIT TYPE AND RH: 5100
UNIT TYPE AND RH: 5100
Unit Type and Rh: 5100
Unit Type and Rh: 5100

## 2017-10-28 LAB — TYPE AND SCREEN
ABO/RH(D): O POS
Antibody Screen: NEGATIVE
UNIT DIVISION: 0
Unit division: 0
Unit division: 0
Unit division: 0

## 2017-10-28 MED ORDER — IBUPROFEN 600 MG PO TABS: 600.0000 mg | ORAL_TABLET | Freq: Four times a day (QID) | ORAL | 0 refills | Status: DC

## 2017-10-28 MED ORDER — DOCUSATE SODIUM 100 MG PO CAPS: 100.0000 mg | ORAL_CAPSULE | Freq: Two times a day (BID) | ORAL | 2 refills | Status: DC

## 2017-10-28 MED ORDER — TRAMADOL HCL 50 MG PO TABS: 50.0000 mg | ORAL_TABLET | Freq: Four times a day (QID) | ORAL | 0 refills | Status: DC | PRN

## 2017-10-28 NOTE — Discharge Summary (Signed)
Obstetric Discharge Summary Reason for Admission: vaginal bleeding in setting of placenta previa Prenatal Procedures: BMZ Intrapartum Procedures: cesarean: low cervical, transverse, bleeding placenta previa.  Postpartum Procedures: none Complications-Operative and Postpartum: none Hemoglobin  Date Value Ref Range Status  10/26/2017 9.7 (L) 12.0 - 15.0 g/dL Final   HCT  Date Value Ref Range Status  10/26/2017 28.5 (L) 36.0 - 46.0 % Final    Physical Exam:  General: alert, cooperative and appears stated age 37: appropriate Uterine Fundus: firm Incision: healing well, no significant drainage, no dehiscence, no significant erythema DVT Evaluation: No evidence of DVT seen on physical exam. Negative Homan's sign. No cords or calf tenderness. No significant calf/ankle edema.  Discharge Diagnoses: Preterm CS s/s bleeding placenta previa  Discharge Information: Date: 10/28/2017 Activity: pelvic rest Diet: routine Medications: Ibuprofen, Colace and tramadol - aware risks of it in breastfeeding Condition: stable Instructions: refer to practice specific booklet Discharge to: home   Newborn Data: Live born female  Birth Weight: 3 lb (1360 g) APGAR: 3, 9  Newborn Delivery   Birth date/time:  10/25/2017 23:08:00 Delivery type:  C-Section, Low Transverse Trial of labor:  No C-section categorization:  Primary     Home with NICU.  Tyson Dense 10/28/2017, 9:24 AM

## 2017-10-28 NOTE — Lactation Note (Signed)
This note was copied from a baby's chart. Lactation Consultation Note: Experienced BF mom pumping for baby in NICU. Reports breasts are feeling fuller this morning. Has lump in right armpit that is getting larger. Encouraged massage and heat. (Not under left armpit). Reviewed our phone number to call with questions or if needs assist when ready to put baby to breast. No questions at present.   Patient Name: Kelly Keller [id number] Date: 10/28/2017 Reason for consult: Follow-up assessment;Preterm <34wks   Maternal Data Formula Feeding for Exclusion: No Has patient been taught Hand Expression?: Yes Does the patient have breastfeeding experience prior to this delivery?: Yes  Feeding    LATCH Score                   Interventions    Lactation Tools Discussed/Used WIC Program: No   Consult Status Consult Status: Complete    Truddie Crumble 10/28/2017, 9:38 AM

## 2017-10-28 NOTE — Progress Notes (Signed)
Patient discharged waiting for ride. 

## 2017-10-28 NOTE — Addendum Note (Signed)
Addendum  created 10/28/17 1954 by Janeece Riggers, MD   Intraprocedure Event deleted, Intraprocedure Event edited

## 2017-10-29 ENCOUNTER — Encounter (HOSPITAL_COMMUNITY): Payer: Self-pay | Admitting: Obstetrics and Gynecology

## 2017-11-07 DIAGNOSIS — K080 Exfoliation of teeth due to systemic causes: Secondary | ICD-10-CM | POA: Diagnosis not present

## 2017-11-19 DIAGNOSIS — K080 Exfoliation of teeth due to systemic causes: Secondary | ICD-10-CM | POA: Diagnosis not present

## 2017-12-03 DIAGNOSIS — Z1389 Encounter for screening for other disorder: Secondary | ICD-10-CM | POA: Diagnosis not present

## 2017-12-03 DIAGNOSIS — N390 Urinary tract infection, site not specified: Secondary | ICD-10-CM | POA: Diagnosis not present

## 2017-12-08 ENCOUNTER — Ambulatory Visit: Payer: Self-pay

## 2017-12-08 NOTE — Lactation Note (Signed)
This note was copied from a baby's chart. Lactation Consultation Note  Patient Name: Kelly Keller [id number] Date: 12/08/2017 Reason for consult: Follow-up assessment;NICU baby Called to NICU to assist with latching baby to breast.  Mom is pumping every 3 hours and has abundant milk supply.  She obtains 7-8 ounces combined.  She is worried about overproducing.  Reassured and instructed to continue pumping 8 times/24 hours.  Praised for all her hard work and effort.  Mom feeling disappointed baby isn't doing better at breast.  Baby is 36.4 cga.  Discussed normal preterm feeding behavior.  Baby is currently sleepy.  Positioned baby in cross cradle hold.  Baby won't open mouth and not showing any signs of hunger.  Mom has an ample size erect nipple.  I applied a 24 mm nipple shield to try to elicit a suck.  Baby held nipple in his mouth and no sucking achieved.  Discussed normal inconsistency with a preterm baby that should improve as baby reaches term or soon after.  Mom states she is comparing baby with her full term infants.  Support and encouragement given.  Will follow up as needed.  Maternal Data    Feeding Feeding Type: Breast Fed  LATCH Score Latch: Too sleepy or reluctant, no latch achieved, no sucking elicited.  Audible Swallowing: None  Type of Nipple: Everted at rest and after stimulation  Comfort (Breast/Nipple): Soft / non-tender  Hold (Positioning): Assistance needed to correctly position infant at breast and maintain latch.  LATCH Score: 5  Interventions Interventions: Assisted with latch;Breast compression;Skin to skin;Adjust position;Breast massage;Support pillows  Lactation Tools Discussed/Used     Consult Status Consult Status: PRN    Ave Filter 12/08/2017, 12:22 PM

## 2017-12-10 ENCOUNTER — Inpatient Hospital Stay (HOSPITAL_COMMUNITY): Admit: 2017-12-10 | Payer: Federal, State, Local not specified - PPO | Admitting: Obstetrics & Gynecology

## 2018-03-15 ENCOUNTER — Encounter: Payer: Self-pay | Admitting: Sports Medicine

## 2018-03-15 ENCOUNTER — Ambulatory Visit: Payer: Federal, State, Local not specified - PPO | Admitting: Sports Medicine

## 2018-03-15 DIAGNOSIS — M79675 Pain in left toe(s): Secondary | ICD-10-CM

## 2018-03-15 DIAGNOSIS — L600 Ingrowing nail: Secondary | ICD-10-CM

## 2018-03-15 MED ORDER — NEOMYCIN-POLYMYXIN-HC 3.5-10000-1 OT SOLN: OTIC | 0 refills | Status: DC

## 2018-03-15 NOTE — Patient Instructions (Signed)

## 2018-03-15 NOTE — Progress Notes (Signed)
Subjective: Kelly Keller is a 38 y.o. female patient presents to office today complaining of a moderately painful incurvated, red, hot, swollen medial>lateral nail border of the 1st toe on the left foot. This has been present for months and had problems with ingrown as a child. Patient has treated this by soaking. Patient denies fever/chills/nausea/vomitting/any other related constitutional symptoms at this time.  Review of Systems  All other systems reviewed and are negative.    Patient Active Problem List   Diagnosis Date Noted  . Antepartum hemorrhage from placenta previa 10/25/2017  . Complete placenta previa with hemorrhage, third trimester 10/16/2017    Current Outpatient Medications on File Prior to Visit  Medication Sig Dispense Refill  . docusate sodium (COLACE) 100 MG capsule Take 1 capsule (100 mg total) by mouth 2 (two) times daily. (Patient not taking: Reported on 03/15/2018) 30 capsule 2  . ibuprofen (ADVIL,MOTRIN) 600 MG tablet Take 1 tablet (600 mg total) by mouth every 6 (six) hours. (Patient not taking: Reported on 03/15/2018) 30 tablet 0  . Prenatal Vit-Fe Fumarate-FA (MULTIVITAMIN-PRENATAL) 27-0.8 MG TABS tablet Take 1 tablet by mouth at bedtime.     . traMADol (ULTRAM) 50 MG tablet Take 1 tablet (50 mg total) by mouth every 6 (six) hours as needed for moderate pain. (Patient not taking: Reported on 03/15/2018) 15 tablet 0   No current facility-administered medications on file prior to visit.     Allergies  Allergen Reactions  . Penicillins Rash    Has patient had a PCN reaction causing immediate rash, facial/tongue/throat swelling, SOB or lightheadedness with hypotension: unknown Has patient had a PCN reaction causing severe rash involving mucus membranes or skin necrosis: unknown Has patient had a PCN reaction that required hospitalization: No Has patient had a PCN reaction occurring within the last 10 years: No If all of the above answers are "NO", then may  proceed with Cephalosporin use.   . Tylenol With Codeine #3 [Acetaminophen-Codeine] Rash    Objective:  There were no vitals filed for this visit.  General: Well developed, nourished, in no acute distress, alert and oriented x3   Dermatology: Skin is warm, dry and supple bilateral. Left hallux nail appears to be  severely incurvated with minimal hyperkeratosis formation at the distal aspects of  the medial>lateral nail border. (+) Erythema. (+) Edema. (-) serosanguous  drainage present. The remaining nails appear unremarkable at this time. There are no open sores, lesions or other signs of infection  present.  Vascular: Dorsalis Pedis artery and Posterior Tibial artery pedal pulses are 2/4 bilateral with immedate capillary fill time. Pedal hair growth present. No lower extremity edema.   Neruologic: Grossly intact via light touch bilateral.  Musculoskeletal: Tenderness to palpation of the Left hallux nail fold(s). Muscular strength within normal limits in all groups bilateral.   Assesement and Plan: Problem List Items Addressed This Visit    None    Visit Diagnoses    Ingrown nail    -  Primary   Toe pain, left          -Discussed treatment alternatives and plan of care; Explained permanent/temporary nail avulsion and post procedure course to patient. Patient elects for Left hallux PNA.  - After a verbal and written consent, injected 3 ml of a 50:50 mixture of 2% plain  lidocaine and 0.5% plain marcaine in a normal hallux block fashion. Next, a  betadine prep was performed. Anesthesia was tested and found to be appropriate.  The offending Left  hallux medial and lateral nail borders were then incised from the hyponychium to the epinychium. The offending nail border was removed and cleared from the field. The area was curretted for any remaining nail or spicules. Phenol application performed and the area was then flushed with alcohol and dressed with antibiotic cream and a dry  sterile dressing. -Patient was instructed to leave the dressing intact for today and begin soaking in a weak solution of betadine or Epsom salt and water tomorrow. Patient was instructed to soak for 15-20 minutes each day and apply corticosporin and a gauze or bandaid dressing each day. -Patient was instructed to monitor the toe for signs of infection and return to office if toe becomes red, hot or swollen. -Advised ice, elevation, and tylenol or motrin if needed for pain.  -Patient is to return in 2 weeks for follow up care/nail check or sooner if problems arise.  Landis Martins, DPM

## 2018-03-28 ENCOUNTER — Ambulatory Visit: Payer: Federal, State, Local not specified - PPO

## 2019-03-06 DIAGNOSIS — Z01419 Encounter for gynecological examination (general) (routine) without abnormal findings: Secondary | ICD-10-CM | POA: Diagnosis not present

## 2019-03-06 DIAGNOSIS — N920 Excessive and frequent menstruation with regular cycle: Secondary | ICD-10-CM | POA: Diagnosis not present

## 2019-04-04 IMAGING — US US MFM OB DETAIL+14 WK
1 series · 14 of 28 positions shown · non-contrast
Comparison: none

[Series 1: us mfm ob detail+14 wk · 42 acquisitions, 14 frames shown]
[im 2/42]
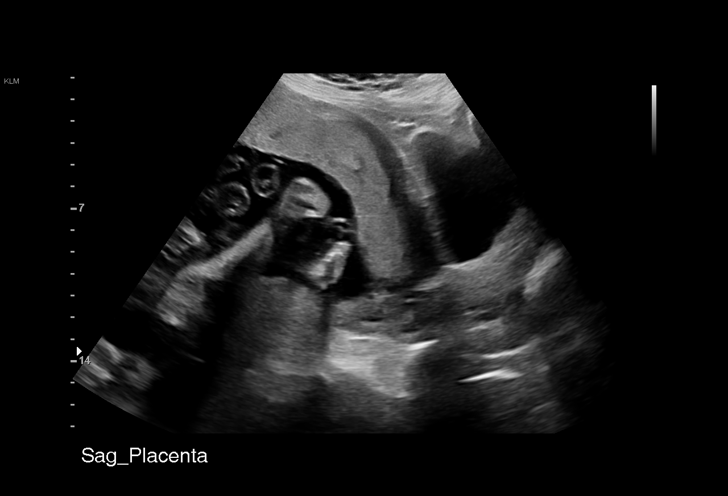
[im 5/42]
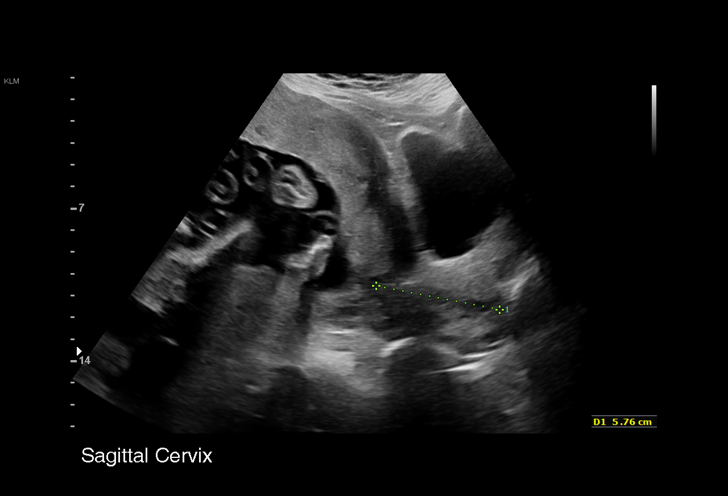
[im 8/42]
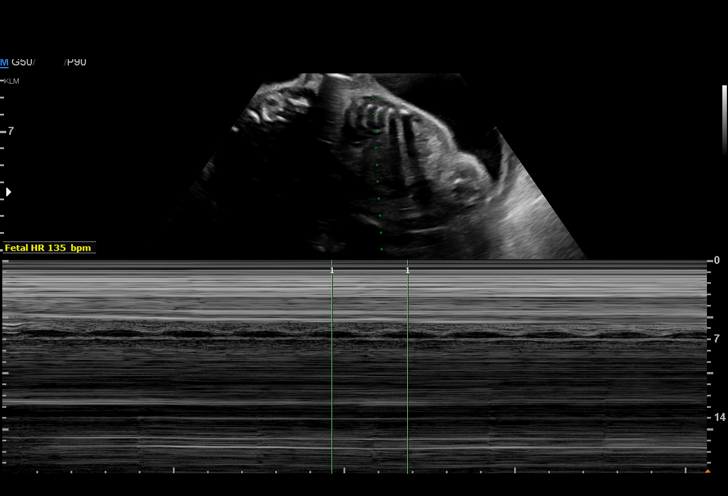
[im 11/42]
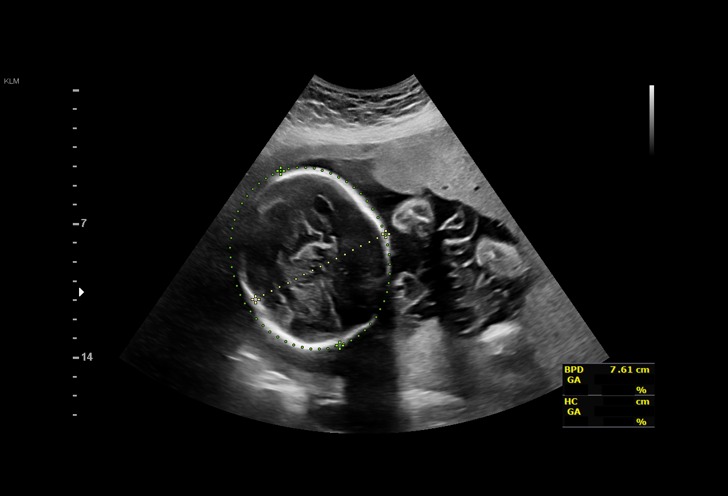
[im 14/42]
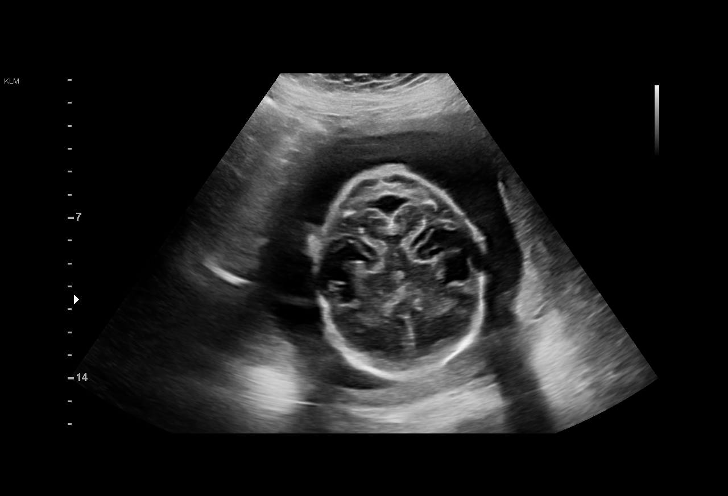
[im 17/42]
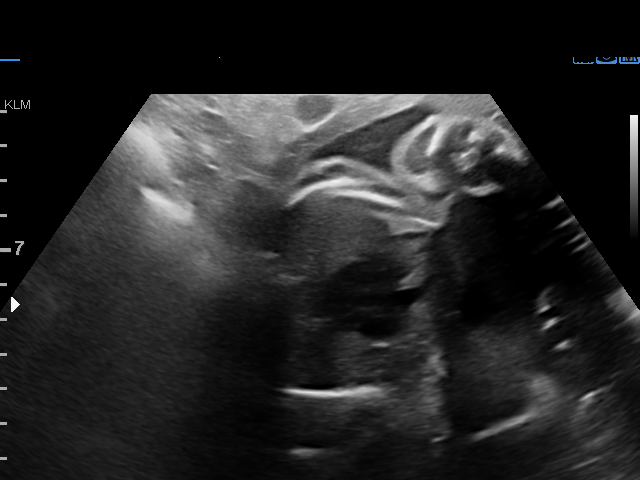
[im 20/42]
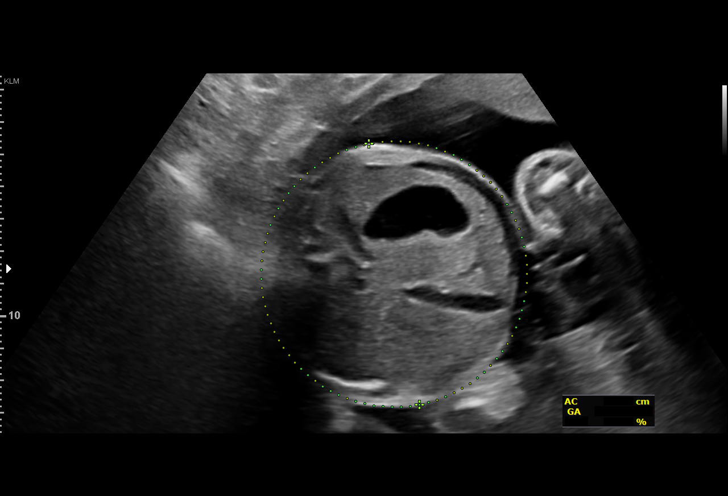
[im 23/42]
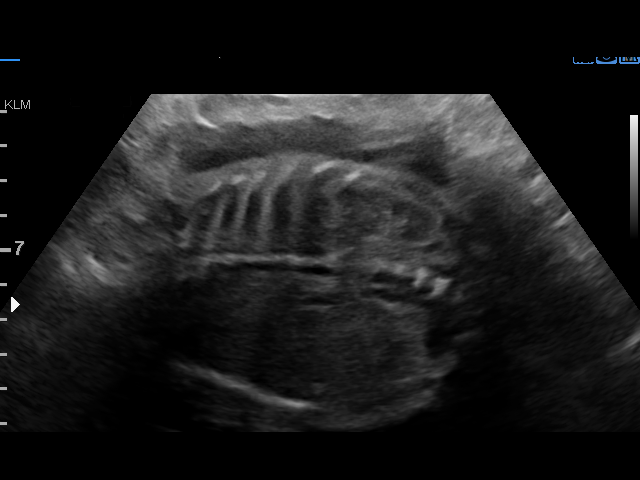
[im 26/42]
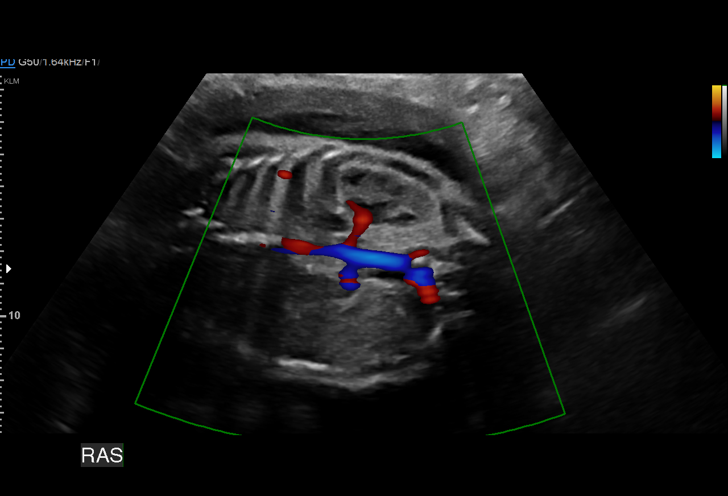
[im 29/42]
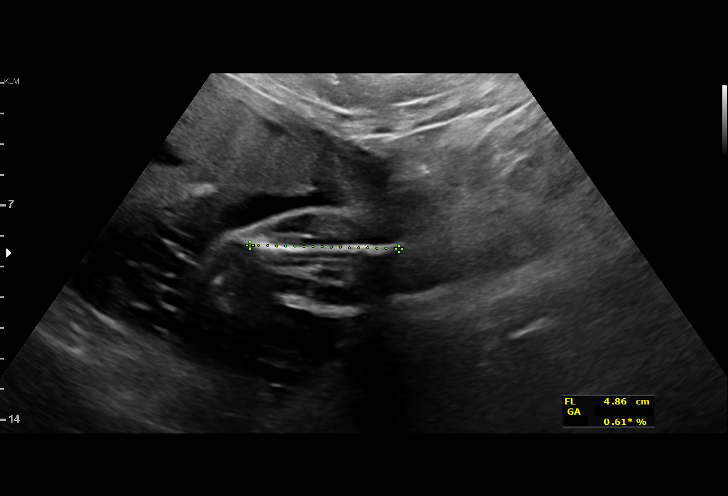
[im 32/42]
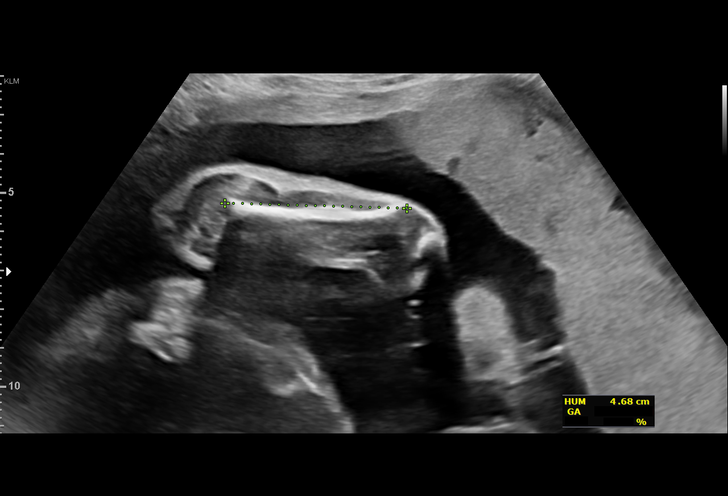
[im 35/42]
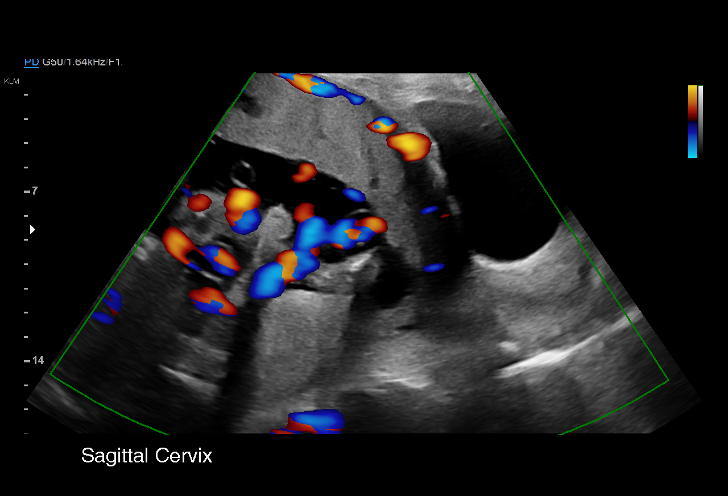
[im 38/42]
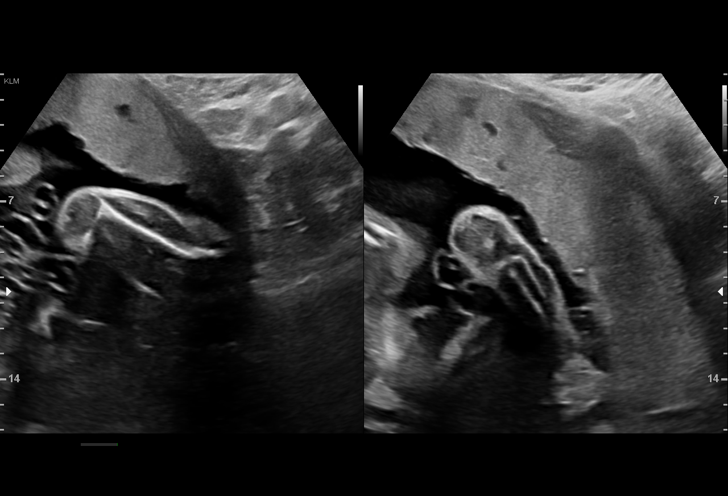
[im 42/42]
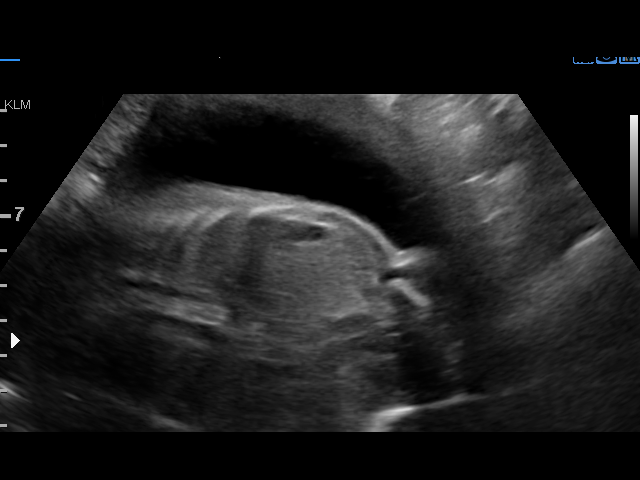

[14 of 28 positions shown; findings below may reference images not displayed]

[REDACTED]
Attending:        Quaidoo Snyper        Secondary Phy.:   3rd Nursing- [employer]
OB

Indications

Advanced maternal age multigravida 35+,
third trimester
29 weeks gestation of pregnancy
Preterm contractions
Placenta previa with hemorrhage, third
trimester
Encounter for antenatal screening for
malformations
Fetal Evaluation

Num Of Fetuses:         1
Fetal Heart Rate(bpm):  135
Cardiac Activity:       Observed
Presentation:           Variable
Placenta:               Anterior previa
P. Cord Insertion:      Visualized

Amniotic Fluid
AFI FV:      Within normal limits

Largest Pocket(cm)
5.7
Biometry

BPD:      75.3  mm     G. Age:  30w 2d         76  %    CI:        75.17   %    70 - 86
FL/HC:      18.4   %    19.6 -
HC:      275.5  mm     G. Age:  30w 1d         50  %    HC/AC:      1.08        0.99 -
AC:      255.7  mm     G. Age:  29w 5d         66  %    FL/BPD:     67.3   %    71 - 87
FL:       50.7  mm     G. Age:  27w 1d          4  %    FL/AC:      19.8   %    20 - 24
HUM:      45.6  mm     G. Age:  27w 0d        < 5  %

Est. FW:    1615  gm    2 lb 14 oz      51  %
OB History

Gravidity:    3         Term:   2        Prem:   0        SAB:   0
TOP:          0       Ectopic:  0        Living: 2
Gestational Age

Clinical EDD:  29w 0d                                        EDD:   01/01/18
U/S Today:     29w 2d                                        EDD:   12/30/17
Best:          29w 0d     Det. By:  Clinical EDD             EDD:   01/01/18
Anatomy

Cranium:               Appears normal         Aortic Arch:            Appears normal
Cavum:                 Appears normal         Ductal Arch:            Not well visualized
Ventricles:            Appears normal         Diaphragm:              Appears normal
Choroid Plexus:        Appears normal         Stomach:                Appears normal, left
sided
Cerebellum:            Appears normal         Abdomen:                Appears normal
Posterior Fossa:       Appears normal         Abdominal Wall:         Not well visualized
Nuchal Fold:           Not applicable (>20    Cord Vessels:           Not well visualized
wks GA)
Face:                  Not well visualized    Kidneys:                Appear normal
Lips:                  Not well visualized    Bladder:                Appears normal
Thoracic:              Appears normal         Spine:                  Not well visualized
Heart:                 Appears normal         Upper Extremities:      Not well visualized
(4CH, axis, and situs
RVOT:                  Not well visualized    Lower Extremities:      Appears normal
LVOT:                  Not well visualized

Other:  Technically difficult due to fetal position.
Cervix Uterus Adnexa

Cervix
Length:              5  cm.
Normal appearance by transabdominal scan.
Impression

Patient is admitted with c/o vaginal bleeding. Known placenta
previa.
We performed fetal anatomy scan. No makers of
aneuploidies or fetal structural defects are seen. Fetal
biometry is consistent with her previously-established dates.
Amniotic fluid is normal and good fetal activity is seen.
Placenta is anterior and the placental edge is reaching down
and covering internal os (placenta previa). There is no
evidence of accreta.

## 2019-04-13 IMAGING — US US MFM OB TRANSVAGINAL
1 series · 14 of 28 positions shown · non-contrast
Comparison: none

[Series 1: us mfm ob transvaginal · 14 of 64 slices shown]
[im 3/64]
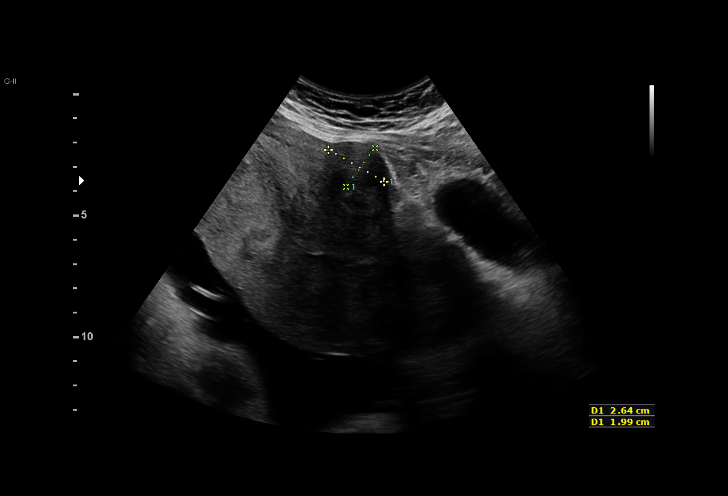
[im 8/64]
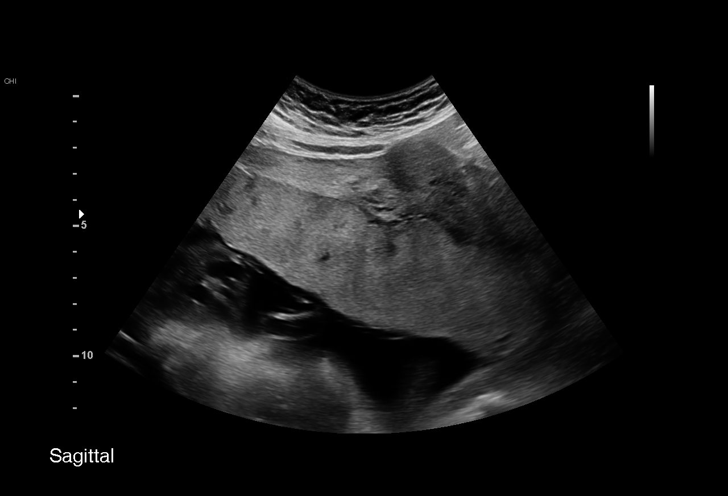
[im 12/64]
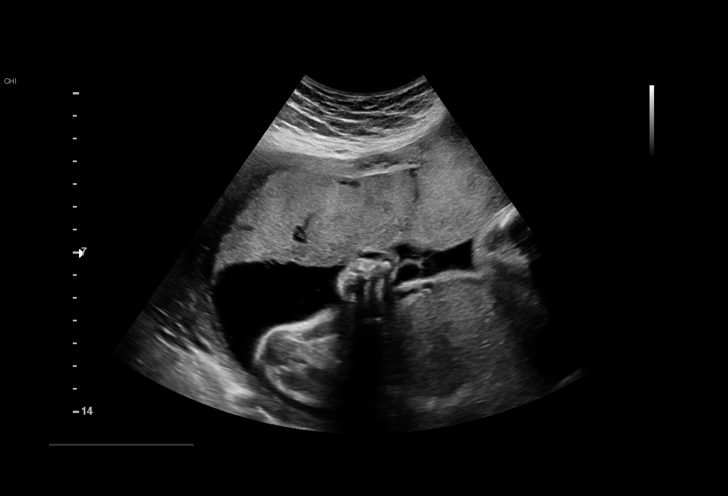
[im 17/64]
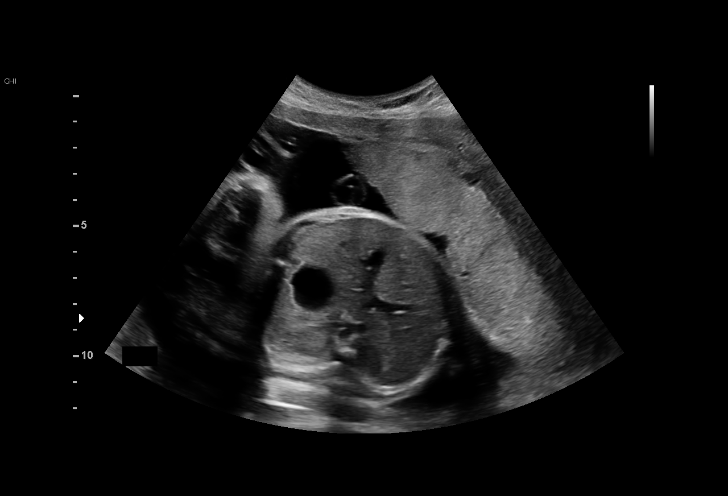
[im 22/64]
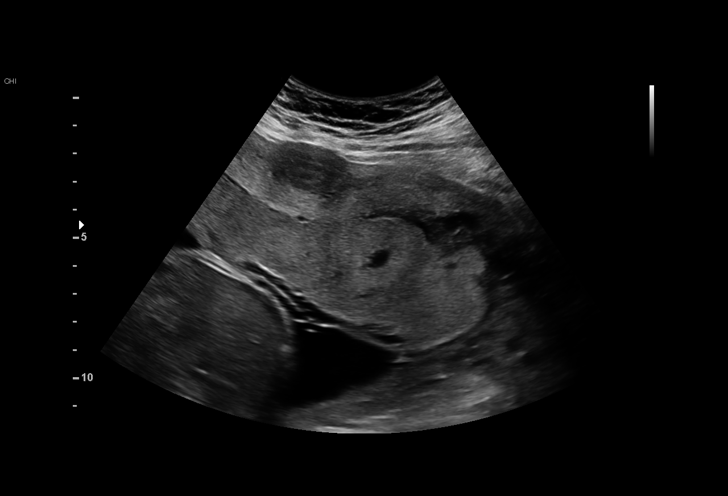
[im 26/64]
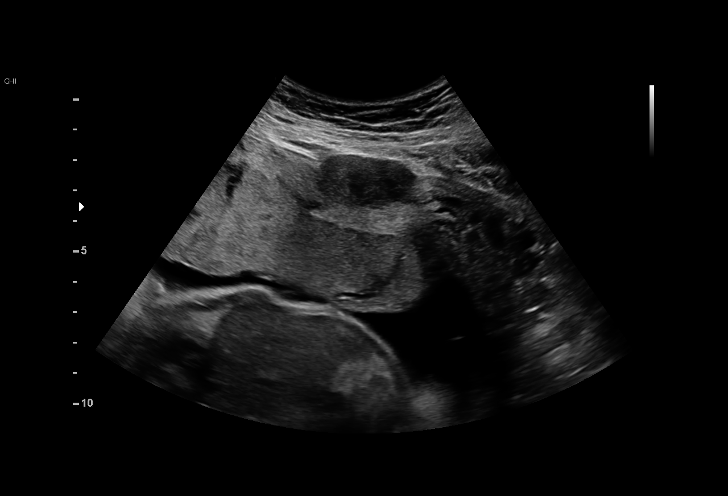
[im 31/64]
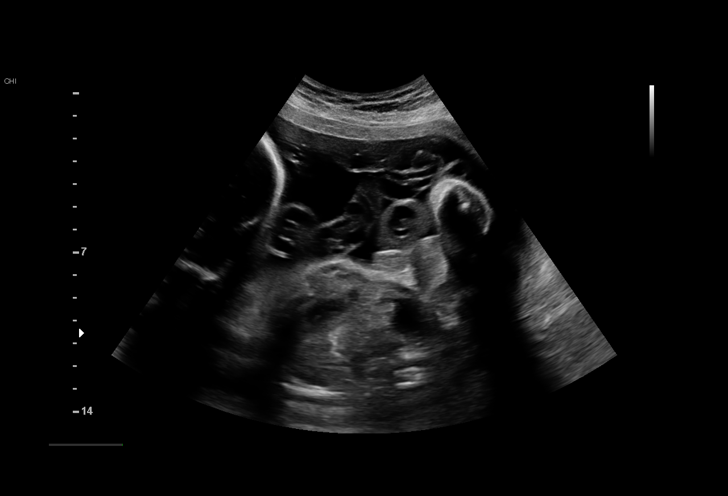
[im 36/64]
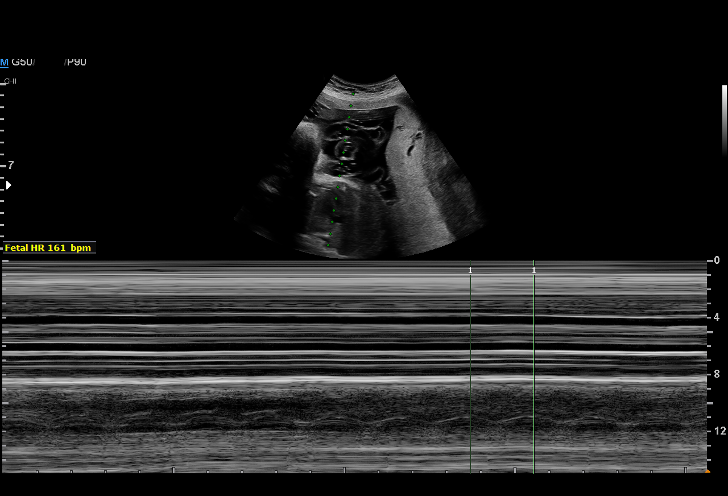
[im 40/64]
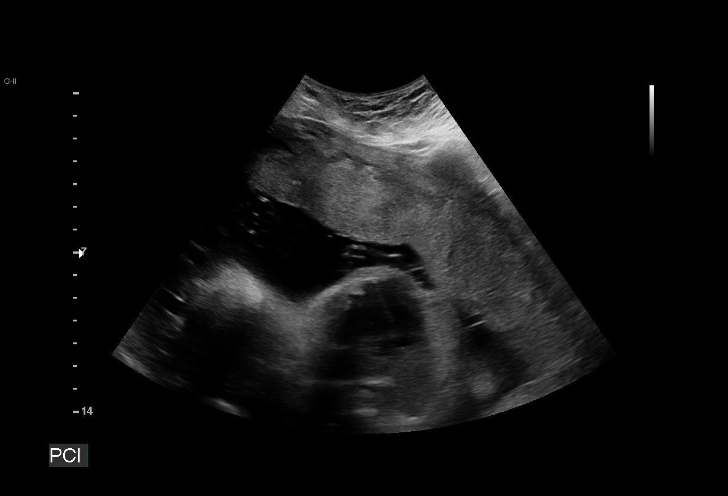
[im 45/64]
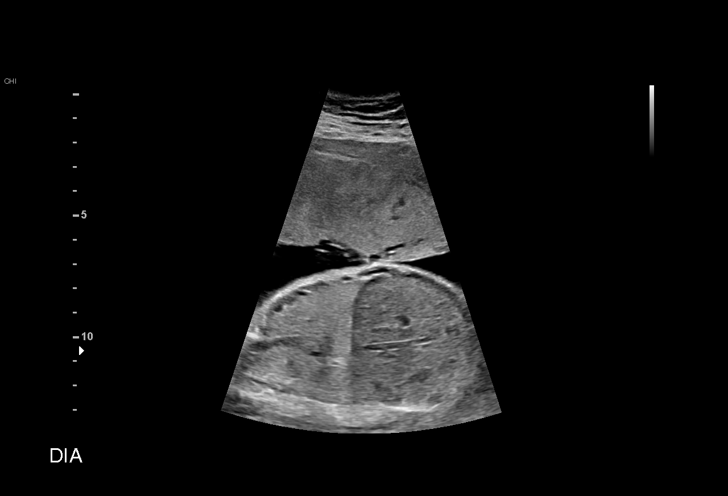
[im 50/64]
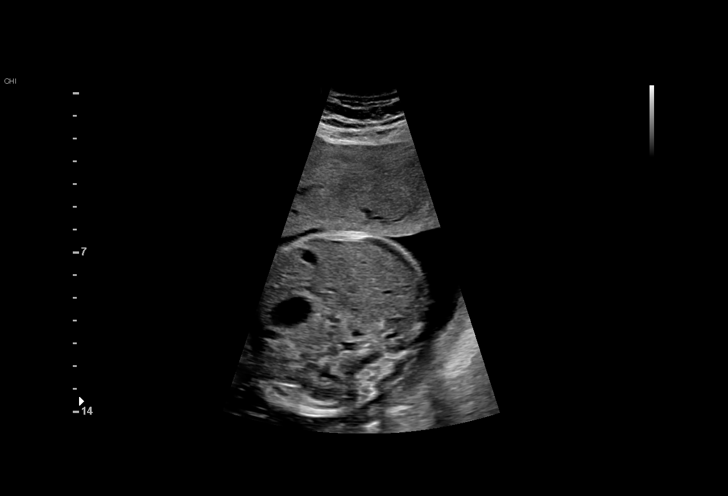
[im 54/64]
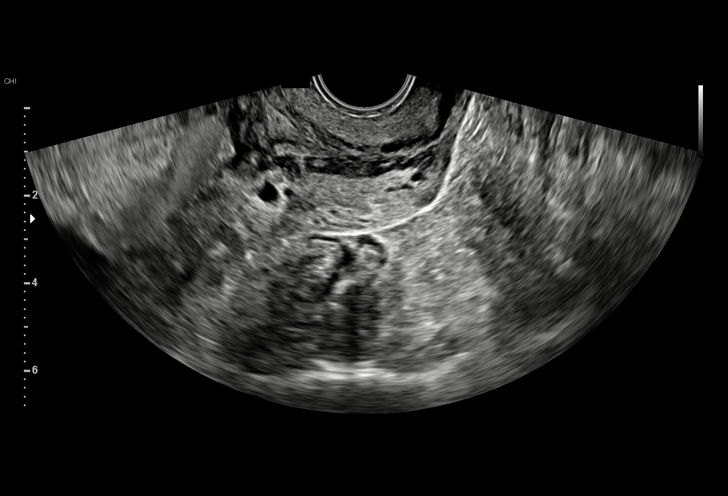
[im 59/64]
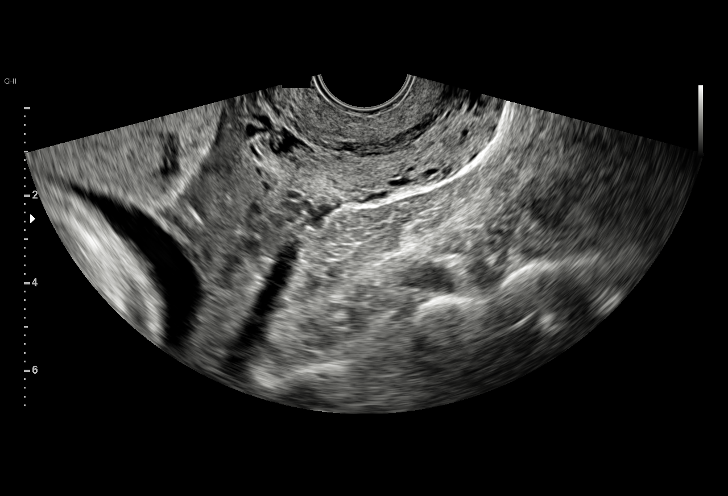
[im 64/64]
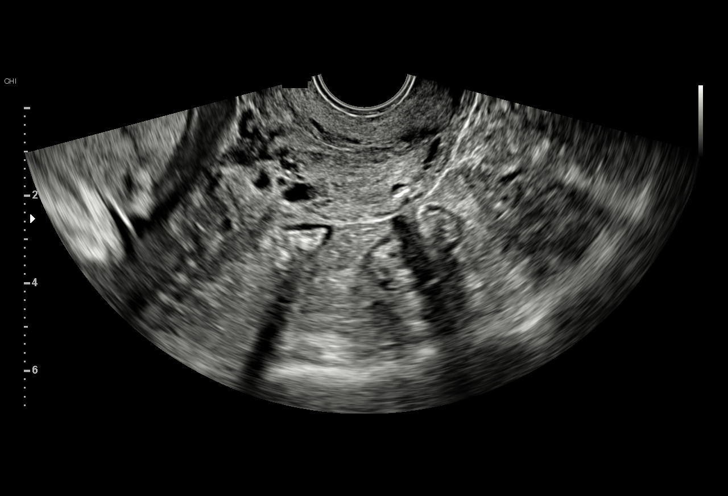

[14 of 28 positions shown; findings below may reference images not displayed]

[REDACTED]
Attending:        Rosnie Sufia        Secondary Phy.:   3rd Nursing- [employer]
OB

1  US MFM OB LIMITED                    76815.01     ROBERVAN EBELING

Indications

30 weeks gestation of pregnancy
Advanced maternal age multigravida 35+,
third trimester
Preterm contractions
Placenta previa with hemorrhage, third
trimester
Fetal Evaluation

Num Of Fetuses:         1
Fetal Heart Rate(bpm):  161
Cardiac Activity:       Observed
Presentation:           Transverse, head to maternal right
Placenta:               Anterior previa
P. Cord Insertion:      Visualized, central

Amniotic Fluid
AFI FV:      Within normal limits

AFI Sum(cm)     %Tile       Largest Pocket(cm)
14.39           49

RUQ(cm)       RLQ(cm)       LUQ(cm)        LLQ(cm)
5.5

Comment:    Fetal movement and breathing noted.
OB History

Gravidity:    3         Term:   2        Prem:   0        SAB:   0
TOP:          0       Ectopic:  0        Living: 2
Gestational Age

Clinical EDD:  30w 2d                                        EDD:   01/01/18
Best:          30w 2d     Det. By:  Clinical EDD             EDD:   01/01/18
Anatomy

Cranium:               Appears normal         Abdominal Wall:         Appears nml (cord
insert, abd wall)
Cavum:                 Appears normal         Cord Vessels:           Appears normal (3
vessel cord)
Ventricles:            Appears normal         Kidneys:                Appear normal
Heart:                 Appears normal         Bladder:                Appears normal
(4CH, axis, and
situs)
Stomach:               Appears normal, left
sided
Cervix Uterus Adnexa

Cervix
Length:            4.4  cm.
Measured transvaginally.
Myomas

Site                     L(cm)      W(cm)      D(cm)      Location
LUS, anterior            2.6        2.9        2          Intramural

Blood Flow                 RI        PI       Comments

Impression

Patient with known placenta previa is admitted with c/o
vaginal bleeding and uterine contractions.

On ultrasound, amniotic fluid is normal and good fetal activity
is seen. Transverse lie with head to maternal right. We
performed transvaginal ultrasound to evaluate the placenta.
Placenta previa covering the internal os is seen. The cervix
measures 4.4 cm.
A small anterior intramural myoma is seen (see above).
Recommendations

Fetal growth assessment in 2 weeks.

## 2019-09-21 ENCOUNTER — Emergency Department (HOSPITAL_BASED_OUTPATIENT_CLINIC_OR_DEPARTMENT_OTHER): Payer: Federal, State, Local not specified - PPO

## 2019-09-21 ENCOUNTER — Other Ambulatory Visit: Payer: Self-pay

## 2019-09-21 ENCOUNTER — Encounter (HOSPITAL_BASED_OUTPATIENT_CLINIC_OR_DEPARTMENT_OTHER): Payer: Self-pay

## 2019-09-21 ENCOUNTER — Emergency Department (HOSPITAL_BASED_OUTPATIENT_CLINIC_OR_DEPARTMENT_OTHER)
Admission: EM | Admit: 2019-09-21 | Discharge: 2019-09-21 | Disposition: A | Discharge status: Home / Self Care | Payer: Federal, State, Local not specified - PPO | Attending: Emergency Medicine | Admitting: Emergency Medicine

## 2019-09-21 DIAGNOSIS — R0789 Other chest pain: Secondary | ICD-10-CM | POA: Insufficient documentation

## 2019-09-21 DIAGNOSIS — R519 Headache, unspecified: Secondary | ICD-10-CM

## 2019-09-21 DIAGNOSIS — R079 Chest pain, unspecified: Secondary | ICD-10-CM | POA: Diagnosis not present

## 2019-09-21 DIAGNOSIS — R030 Elevated blood-pressure reading, without diagnosis of hypertension: Secondary | ICD-10-CM | POA: Insufficient documentation

## 2019-09-21 LAB — CBC
HCT: 41.7 % (ref 36.0–46.0)
Hemoglobin: 13.6 g/dL (ref 12.0–15.0)
MCH: 28.6 pg (ref 26.0–34.0)
MCHC: 32.6 g/dL (ref 30.0–36.0)
MCV: 87.6 fL (ref 80.0–100.0)
Platelets: 192 10*3/uL (ref 150–400)
RBC: 4.76 MIL/uL (ref 3.87–5.11)
RDW: 12 % (ref 11.5–15.5)
WBC: 7.3 10*3/uL (ref 4.0–10.5)
nRBC: 0 % (ref 0.0–0.2)

## 2019-09-21 LAB — TROPONIN I (HIGH SENSITIVITY)
Troponin I (High Sensitivity): 2 ng/L (ref <18)
Troponin I (High Sensitivity): 2 ng/L (ref <18)

## 2019-09-21 LAB — BASIC METABOLIC PANEL
Anion gap: 11 (ref 5–15)
BUN: 12 mg/dL (ref 6–20)
CO2: 24 mmol/L (ref 22–32)
Calcium: 9.7 mg/dL (ref 8.9–10.3)
Chloride: 101 mmol/L (ref 98–111)
Creatinine, Ser: 0.64 mg/dL (ref 0.44–1.00)
GFR calc Af Amer: >60 mL/min (ref >60)
GFR calc non Af Amer: >60 mL/min (ref >60)
Glucose, Bld: 90 mg/dL (ref 70–99)
Potassium: 3.9 mmol/L (ref 3.5–5.1)
Sodium: 136 mmol/L (ref 135–145)

## 2019-09-21 LAB — PREGNANCY, URINE: Preg Test, Ur: NEGATIVE

## 2019-09-21 MED ORDER — ACETAMINOPHEN 500 MG PO TABS
1000.0000 mg | ORAL_TABLET | Freq: Once | ORAL | Status: AC
  Administered 2019-09-21: 1000 mg via ORAL
  Filled 2019-09-21: qty 2

## 2019-09-21 MED ORDER — KETOROLAC TROMETHAMINE 30 MG/ML IJ SOLN
30.0000 mg | Freq: Once | INTRAMUSCULAR | Status: AC
  Administered 2019-09-21: 30 mg via INTRAVENOUS
  Filled 2019-09-21: qty 1

## 2019-09-21 MED ORDER — METOCLOPRAMIDE HCL 5 MG/ML IJ SOLN
5.0000 mg | Freq: Once | INTRAMUSCULAR | Status: AC
  Administered 2019-09-21: 5 mg via INTRAVENOUS
  Filled 2019-09-21: qty 2

## 2019-09-21 MED ORDER — LABETALOL HCL 100 MG PO TABS: 100.0000 mg | ORAL_TABLET | Freq: Every day | ORAL | 0 refills | Status: DC

## 2019-09-21 NOTE — ED Triage Notes (Signed)
Pt arrives with c/o hypertension and headaches. Reports her headaches are shooting with some pain in her back and chest. Pt reports she is still nursing so she only takes tylenol.

## 2019-09-21 NOTE — Discharge Instructions (Addendum)
You were seen in the ED for elevated blood pressure reading, headache, chest pains, vision changes  Lab work and cardiac work-up was normal.  blood pressure improved significantly once headache was controlled  We discussed no evidence of high blood pressure emergency or crisis today  I have prescribed blood pressure medicine that he can start taking every day.  Avoid checking your blood pressure when you are under stress, or in pain.  Your blood pressure can be elevated from stress or pain response.  Call your primary care doctor soon as possible to make an appointment for follow-up, at that time they may consider discontinuing blood pressure medicines and observing your blood pressure without them.  Return to the ED for sudden severe headache, sudden or new vision changes, nausea, vomiting, chest pain or shortness of breath, new leg swelling

## 2019-09-21 NOTE — ED Provider Notes (Signed)
[facility] Provider Note   CSN: [id number] Arrival date & time: 09/21/19  1006     History Chief Complaint  Patient presents with  . Hypertension  . Headache    Kelly Keller is a 39 y.o. female with past medical history of headaches presents to the ED for evaluation of elevated blood pressure reading, headache and chest pain.  Reports previous history of elevated blood pressure but never prescribed medicines for it.  Unknown of ranges at home. Strong family history of high blood pressure. States usually she knows her BP is "acting up" when she gets headaches and blurred vision.    Reports long history of headaches.  Headache for last 3 days.  Initially headache is mild, gradually worsens and lingers in day.  Headache is located to the right posterior scalp without any radiation feels like a throb.  Typically takes Tylenol and the headaches go away but in the last 3 days the headaches just keep coming back.  Yesterday she was doing back-to-school shopping at [employer] with her children and noticed that headache intensified so she checked her blood pressure and it was 182/101 around 6:30 PM yesterday.  Reports having associated blurred vision and seeing spots every time she has elevated blood pressure.  States that is "how I know my blood pressure is high".  Has mentioned the symptoms to her primary care doctor who has told her to go get her eyes checked by an eye doctor. Her vision currently is normal.  No head trauma, falls, anticoagulant use.  Denies neck pain or stiffness, nausea, vomiting, unilateral weakness or numbness.  Denies history of pre or eclampsia.  Currently her headache is a 3/10, mild.  Has associated photophobia. No sinus issues or URI symptoms.   Also reports having some chest pains.  The chest pain is localized in the left upper chest, sharp.  Sometimes soreness worse it feels like it shoots to her back.  No pain with breathing or moving. No  fever, cough, SOB, unilateral weakness or extremity numbness or pain. No leg swelling, orthopnea, PND, syncope, light-headedness, palpitations.   States she can work through it and the pain is mild.  Has had similar chest pains before as well and states she is probably "psyching myself out".  Admits to history of anxiety and recent increased stress at home because she has 3 children, 2 are teenagers and going back to school today.  She has a 78-month-old baby which she delivered at 30 weeks and continues to nurse and states situation at home is stressful.  Told in the past that her blood pressure is elevated but was told to monitor it and never started on blood pressure medicines.  No recent surgery, prolonged immobilization, hormone therapy.  No tobacco or illicit drug use. No abdominal pain, nausea, vomiting or post-prandrial pain.   HPI     Past Medical History:  Diagnosis Date  . Medical history non-contributory     Patient Active Problem List   Diagnosis Date Noted  . Antepartum hemorrhage from placenta previa 10/25/2017  . Complete placenta previa with hemorrhage, third trimester 10/16/2017    Past Surgical History:  Procedure Laterality Date  . CESAREAN SECTION N/A 10/25/2017   Procedure: CESAREAN SECTION;  Surgeon: Everlene Farrier, MD;  Location: [facility];  Service: Obstetrics;  Laterality: N/A;  Primary edc 01/06/18 allery to PCN, oxycodone Dr. Corinna Capra to assist  . CHOLECYSTECTOMY    . WISDOM TOOTH EXTRACTION Bilateral  OB History    Gravida  3   Para  3   Term  2   Preterm  1   AB      Living  3     SAB      TAB      Ectopic      Multiple  0   Live Births  3           Family History  Problem Relation Age of Onset  . Heart attack Mother     Social History   Tobacco Use  . Smoking status: Never Smoker  . Smokeless tobacco: Never Used  Substance Use Topics  . Alcohol use: Never  . Drug use: Never    Home Medications Prior to  Admission medications   Medication Sig Start Date End Date Taking? Authorizing Provider  docusate sodium (COLACE) 100 MG capsule Take 1 capsule (100 mg total) by mouth 2 (two) times daily. Patient not taking: Reported on 03/15/2018 10/28/17   Tyson Dense, MD  ibuprofen (ADVIL,MOTRIN) 600 MG tablet Take 1 tablet (600 mg total) by mouth every 6 (six) hours. Patient not taking: Reported on 03/15/2018 10/28/17   Tyson Dense, MD  labetalol (NORMODYNE) 100 MG tablet Take 1 tablet (100 mg total) by mouth daily for 30 doses. 09/21/19 10/21/19  Kinnie Feil, PA-C  neomycin-polymyxin-hydrocortisone (CORTISPORIN) OTIC solution Use 1 -2 drops twice a day to toe 03/15/18   Landis Martins, DPM  Prenatal Vit-Fe Fumarate-FA (MULTIVITAMIN-PRENATAL) 27-0.8 MG TABS tablet Take 1 tablet by mouth at bedtime.     [provider]  traMADol (ULTRAM) 50 MG tablet Take 1 tablet (50 mg total) by mouth every 6 (six) hours as needed for moderate pain. Patient not taking: Reported on 03/15/2018 10/28/17   Tyson Dense, MD    Allergies    Penicillins and Tylenol with codeine #3 [acetaminophen-codeine]  Review of Systems   Review of Systems  Eyes: Positive for visual disturbance (resolved).  Cardiovascular: Positive for chest pain.  Neurological: Positive for headaches.  All other systems reviewed and are negative.   Physical Exam Updated Vital Signs BP 133/89   Pulse 75   Temp 98.5 F (36.9 C) (Oral)   Resp 18   Ht 5\' 1"  (1.549 m)   Wt 90.4 kg   LMP 09/14/2019   SpO2 100%   BMI 37.66 kg/m   Physical Exam Constitutional:      Appearance: She is well-developed.     Comments: NAD. Non toxic.   HENT:     Head: Normocephalic and atraumatic.     Comments: No temporal or scalp tenderness     Nose: Nose normal.  Eyes:     General: Lids are normal.     Conjunctiva/sclera: Conjunctivae normal.  Neck:     Trachea: Trachea normal.     Comments: No muscular neck tenderness.  Full ROM of neck without pain. No meningismus  Cardiovascular:     Rate and Rhythm: Normal rate and regular rhythm.     Pulses:          Radial pulses are 1+ on the right side and 1+ on the left side.       Dorsalis pedis pulses are 1+ on the right side and 1+ on the left side.     Heart sounds: Normal heart sounds, S1 normal and S2 normal.     Comments: No murmurs. No LE edema or calf tenderness.  Pulmonary:  Effort: Pulmonary effort is normal.     Breath sounds: Normal breath sounds.  Chest:     Comments: No reproducible chest wall tenderness  Abdominal:     General: Bowel sounds are normal.     Palpations: Abdomen is soft.     Comments: No epigastric or upper abdominal tenderness.  Musculoskeletal:     Cervical back: Normal range of motion.  Skin:    General: Skin is warm and dry.     Capillary Refill: Capillary refill takes less than 2 seconds.     Comments: No rash to chest wall  Neurological:     Mental Status: She is alert.     GCS: GCS eye subscore is 4. GCS verbal subscore is 5. GCS motor subscore is 6.     Comments:   Mental Status: Patient is awake, alert, oriented to person, place, year, and situation. Patient is able to give a clear and coherent history. Speech is fluent and clear without dysarthria or aphasia. No signs of neglect.  Cranial Nerves: I not tested II visual fields full bilaterally. PERRL.  Unable to visualize posterior eye. III, IV, VI EOMs intact without ptosis or diplopia  V sensation to light touch intact in all 3 divisions of trigeminal nerve bilaterally  VII facial movements symmetric bilaterally VIII hearing intact to voice/conversation  IX, X no uvula deviation, symmetric rise of soft palate/uvula XI 5/5 SCM and trapezius strength bilaterally  XII tongue protrusion midline, symmetric L/R movements  Motor: Strength 5/5 in upper/lower extremities .  Sensation to light touch intact in face, upper/lower extremities. No pronator drift. No leg  drop.  Cerebellar: No ataxia with finger to nose.   Psychiatric:        Speech: Speech normal.        Behavior: Behavior normal.        Thought Content: Thought content normal.     ED Results / Procedures / Treatments   Labs (all labs ordered are listed, but only abnormal results are displayed) Labs Reviewed  BASIC METABOLIC PANEL  CBC  PREGNANCY, URINE  TROPONIN I (HIGH SENSITIVITY)  TROPONIN I (HIGH SENSITIVITY)    EKG EKG Interpretation  Date/Time:  Monday September 21 2019 10:38:20 EDT Ventricular Rate:  77 PR Interval:  156 QRS Duration: 76 QT Interval:  380 QTC Calculation: 430 R Axis:   48 Text Interpretation: Normal sinus rhythm Cannot rule out Anterior infarct , age undetermined Abnormal ECG No previous ECGs available Confirmed by Gareth Morgan [phone]) on 09/21/2019 2:07:48 PM   Radiology DG Chest 2 View  Result Date: 09/21/2019 CLINICAL DATA:  Chest pain. EXAM: CHEST - 2 VIEW COMPARISON:  None. FINDINGS: The heart size and mediastinal contours are within normal limits. Both lungs are clear. No pneumothorax or pleural effusion is noted. The visualized skeletal structures are unremarkable. IMPRESSION: No active cardiopulmonary disease. Electronically Signed   By: Marijo Conception M.D.   On: 09/21/2019 10:50    Procedures Procedures (including critical care time)  Medications Ordered in ED Medications  ketorolac (TORADOL) 30 MG/ML injection 30 mg (30 mg Intravenous Given 09/21/19 1311)  metoCLOPramide (REGLAN) injection 5 mg (5 mg Intravenous Given 09/21/19 1306)  acetaminophen (TYLENOL) tablet 1,000 mg (1,000 mg Oral Given 09/21/19 1305)    ED Course  I have reviewed the triage vital signs and the nursing notes.  Pertinent labs & imaging results that were available during my care of the patient were reviewed by me and considered in my medical  decision making (see chart for details).  Clinical Course as of Sep 20 1437  Mon Sep 21, 2019  1226 X smoking or  illicit drug    [CG]    Clinical Course User Index [CG] Arlean Hopping   MDM Rules/Calculators/A&P                          39 yo F with chief complaint of headaches, chest pain and elevated BP. History of elevated BP in the past but never prescribed medicines.  History of headaches as well with transient blurred vision and seeing spots that she states is how she knows her BP is "flaring up".    ER work up initiated in triage by Therapist, sports.  These were personally visualized and interpreted.    EKG unremarkable. Trop x 2 undetectable. Normal CBC and BMP. Negative pregnancy test.  CXR unremarkable. CP sounds atypical.  Wells score zero and PERC negative.  No URI symptoms. No neuro or pulse deficits distally. Doubt ACS, PE, PTX, dissection.  Denies GI symptoms or post-prandrial pain.   BP completely normalized after migraine cocktail.  HA completely resolved here.  Given this and chronicity of headache with fleeting transient visual disturbances doubt ICH, CVA or other intracranial neurological or hypertensive emergency.    Discussed with patient headache may be secondary to pain response/stress. Given previously having elevated BP, strong family history offered BP medicine.  Also discussed reasonable to wait and fu with PCP to make determination for BP med need.  She would prefer starting medicine due to strong family history. Reasonable.  Will dc with labetalol.  Return precautions given. Discussed with EDP.    Final Clinical Impression(s) / ED Diagnoses Final diagnoses:  Right-sided headache  Elevated blood pressure reading without diagnosis of hypertension    Rx / DC Orders ED Discharge Orders         Ordered    labetalol (NORMODYNE) 100 MG tablet  Daily        09/21/19 1416           Kinnie Feil, [place] 09/21/19 1439    Gareth Morgan, MD 09/22/19 [phone]

## 2019-10-09 DIAGNOSIS — I10 Essential (primary) hypertension: Secondary | ICD-10-CM | POA: Diagnosis not present

## 2019-11-19 DIAGNOSIS — I10 Essential (primary) hypertension: Secondary | ICD-10-CM | POA: Diagnosis not present

## 2019-11-19 DIAGNOSIS — R030 Elevated blood-pressure reading, without diagnosis of hypertension: Secondary | ICD-10-CM | POA: Diagnosis not present

## 2019-11-19 DIAGNOSIS — Z13220 Encounter for screening for lipoid disorders: Secondary | ICD-10-CM | POA: Diagnosis not present

## 2019-11-19 DIAGNOSIS — Z0000 Encounter for general adult medical examination without abnormal findings: Secondary | ICD-10-CM | POA: Diagnosis not present

## 2019-11-19 DIAGNOSIS — F419 Anxiety disorder, unspecified: Secondary | ICD-10-CM | POA: Diagnosis not present

## 2020-01-19 DIAGNOSIS — F419 Anxiety disorder, unspecified: Secondary | ICD-10-CM | POA: Diagnosis not present

## 2020-01-19 DIAGNOSIS — I10 Essential (primary) hypertension: Secondary | ICD-10-CM | POA: Diagnosis not present

## 2020-01-19 DIAGNOSIS — R0789 Other chest pain: Secondary | ICD-10-CM | POA: Diagnosis not present

## 2020-02-17 DIAGNOSIS — R202 Paresthesia of skin: Secondary | ICD-10-CM | POA: Diagnosis not present

## 2020-02-17 DIAGNOSIS — R0789 Other chest pain: Secondary | ICD-10-CM | POA: Diagnosis not present

## 2020-02-17 DIAGNOSIS — R079 Chest pain, unspecified: Secondary | ICD-10-CM | POA: Diagnosis not present

## 2020-02-17 DIAGNOSIS — R000 Tachycardia, unspecified: Secondary | ICD-10-CM | POA: Diagnosis not present

## 2020-02-18 DIAGNOSIS — E669 Obesity, unspecified: Secondary | ICD-10-CM | POA: Diagnosis not present

## 2020-02-18 DIAGNOSIS — I10 Essential (primary) hypertension: Secondary | ICD-10-CM | POA: Diagnosis not present

## 2020-02-18 DIAGNOSIS — F419 Anxiety disorder, unspecified: Secondary | ICD-10-CM | POA: Diagnosis not present

## 2020-02-18 DIAGNOSIS — B029 Zoster without complications: Secondary | ICD-10-CM | POA: Diagnosis not present

## 2020-03-01 DIAGNOSIS — F419 Anxiety disorder, unspecified: Secondary | ICD-10-CM | POA: Diagnosis not present

## 2020-03-01 DIAGNOSIS — I10 Essential (primary) hypertension: Secondary | ICD-10-CM | POA: Diagnosis not present

## 2020-03-15 DIAGNOSIS — F419 Anxiety disorder, unspecified: Secondary | ICD-10-CM | POA: Diagnosis not present

## 2020-03-15 DIAGNOSIS — I10 Essential (primary) hypertension: Secondary | ICD-10-CM | POA: Diagnosis not present

## 2020-04-05 DIAGNOSIS — N760 Acute vaginitis: Secondary | ICD-10-CM | POA: Diagnosis not present

## 2020-04-05 DIAGNOSIS — Z01419 Encounter for gynecological examination (general) (routine) without abnormal findings: Secondary | ICD-10-CM | POA: Diagnosis not present

## 2020-04-12 DIAGNOSIS — F419 Anxiety disorder, unspecified: Secondary | ICD-10-CM | POA: Diagnosis not present

## 2020-04-12 DIAGNOSIS — R002 Palpitations: Secondary | ICD-10-CM | POA: Diagnosis not present

## 2020-04-12 DIAGNOSIS — I10 Essential (primary) hypertension: Secondary | ICD-10-CM | POA: Diagnosis not present

## 2020-04-22 DIAGNOSIS — E669 Obesity, unspecified: Secondary | ICD-10-CM | POA: Diagnosis not present

## 2020-04-22 DIAGNOSIS — R0683 Snoring: Secondary | ICD-10-CM | POA: Diagnosis not present

## 2020-04-22 DIAGNOSIS — E785 Hyperlipidemia, unspecified: Secondary | ICD-10-CM | POA: Diagnosis not present

## 2020-04-22 DIAGNOSIS — I10 Essential (primary) hypertension: Secondary | ICD-10-CM | POA: Diagnosis not present

## 2020-05-18 DIAGNOSIS — E669 Obesity, unspecified: Secondary | ICD-10-CM | POA: Diagnosis not present

## 2020-05-18 DIAGNOSIS — J45909 Unspecified asthma, uncomplicated: Secondary | ICD-10-CM | POA: Diagnosis not present

## 2020-05-18 DIAGNOSIS — Z01818 Encounter for other preprocedural examination: Secondary | ICD-10-CM | POA: Diagnosis not present

## 2020-05-18 DIAGNOSIS — K219 Gastro-esophageal reflux disease without esophagitis: Secondary | ICD-10-CM | POA: Diagnosis not present

## 2020-05-18 DIAGNOSIS — Z79899 Other long term (current) drug therapy: Secondary | ICD-10-CM | POA: Diagnosis not present

## 2020-05-18 DIAGNOSIS — Z6838 Body mass index (BMI) 38.0-38.9, adult: Secondary | ICD-10-CM | POA: Diagnosis not present

## 2020-05-18 DIAGNOSIS — K2970 Gastritis, unspecified, without bleeding: Secondary | ICD-10-CM | POA: Diagnosis not present

## 2020-05-18 DIAGNOSIS — Z880 Allergy status to penicillin: Secondary | ICD-10-CM | POA: Diagnosis not present

## 2020-05-18 DIAGNOSIS — I10 Essential (primary) hypertension: Secondary | ICD-10-CM | POA: Diagnosis not present

## 2020-05-18 DIAGNOSIS — K2960 Other gastritis without bleeding: Secondary | ICD-10-CM | POA: Diagnosis not present

## 2020-05-18 DIAGNOSIS — E785 Hyperlipidemia, unspecified: Secondary | ICD-10-CM | POA: Diagnosis not present

## 2020-05-19 DIAGNOSIS — Z01818 Encounter for other preprocedural examination: Secondary | ICD-10-CM | POA: Diagnosis not present

## 2020-05-19 DIAGNOSIS — E669 Obesity, unspecified: Secondary | ICD-10-CM | POA: Diagnosis not present

## 2020-05-20 DIAGNOSIS — Z6838 Body mass index (BMI) 38.0-38.9, adult: Secondary | ICD-10-CM | POA: Diagnosis not present

## 2020-05-20 DIAGNOSIS — G4710 Hypersomnia, unspecified: Secondary | ICD-10-CM | POA: Diagnosis not present

## 2020-05-20 DIAGNOSIS — E669 Obesity, unspecified: Secondary | ICD-10-CM | POA: Diagnosis not present

## 2020-05-20 DIAGNOSIS — R0683 Snoring: Secondary | ICD-10-CM | POA: Diagnosis not present

## 2020-05-24 DIAGNOSIS — Z713 Dietary counseling and surveillance: Secondary | ICD-10-CM | POA: Diagnosis not present

## 2020-05-24 DIAGNOSIS — E669 Obesity, unspecified: Secondary | ICD-10-CM | POA: Diagnosis not present

## 2020-06-14 DIAGNOSIS — Z131 Encounter for screening for diabetes mellitus: Secondary | ICD-10-CM | POA: Diagnosis not present

## 2020-06-14 DIAGNOSIS — E559 Vitamin D deficiency, unspecified: Secondary | ICD-10-CM | POA: Diagnosis not present

## 2020-06-14 DIAGNOSIS — E785 Hyperlipidemia, unspecified: Secondary | ICD-10-CM | POA: Diagnosis not present

## 2020-06-14 DIAGNOSIS — I10 Essential (primary) hypertension: Secondary | ICD-10-CM | POA: Diagnosis not present

## 2020-06-14 DIAGNOSIS — G4710 Hypersomnia, unspecified: Secondary | ICD-10-CM | POA: Diagnosis not present

## 2020-06-14 DIAGNOSIS — R5383 Other fatigue: Secondary | ICD-10-CM | POA: Diagnosis not present

## 2020-06-14 DIAGNOSIS — E669 Obesity, unspecified: Secondary | ICD-10-CM | POA: Diagnosis not present

## 2020-06-15 DIAGNOSIS — G4733 Obstructive sleep apnea (adult) (pediatric): Secondary | ICD-10-CM | POA: Diagnosis not present

## 2020-06-15 DIAGNOSIS — R0683 Snoring: Secondary | ICD-10-CM | POA: Diagnosis not present

## 2020-06-15 DIAGNOSIS — G4710 Hypersomnia, unspecified: Secondary | ICD-10-CM | POA: Diagnosis not present

## 2020-06-15 DIAGNOSIS — E669 Obesity, unspecified: Secondary | ICD-10-CM | POA: Diagnosis not present

## 2020-06-17 DIAGNOSIS — Z713 Dietary counseling and surveillance: Secondary | ICD-10-CM | POA: Diagnosis not present

## 2020-06-17 DIAGNOSIS — E669 Obesity, unspecified: Secondary | ICD-10-CM | POA: Diagnosis not present

## 2020-06-30 DIAGNOSIS — E669 Obesity, unspecified: Secondary | ICD-10-CM | POA: Diagnosis not present

## 2020-06-30 DIAGNOSIS — Z01818 Encounter for other preprocedural examination: Secondary | ICD-10-CM | POA: Diagnosis not present

## 2020-07-14 DIAGNOSIS — F419 Anxiety disorder, unspecified: Secondary | ICD-10-CM | POA: Diagnosis not present

## 2020-07-14 DIAGNOSIS — I10 Essential (primary) hypertension: Secondary | ICD-10-CM | POA: Diagnosis not present

## 2020-07-14 DIAGNOSIS — E559 Vitamin D deficiency, unspecified: Secondary | ICD-10-CM | POA: Diagnosis not present

## 2020-07-14 DIAGNOSIS — E7800 Pure hypercholesterolemia, unspecified: Secondary | ICD-10-CM | POA: Diagnosis not present

## 2020-07-15 DIAGNOSIS — E559 Vitamin D deficiency, unspecified: Secondary | ICD-10-CM | POA: Diagnosis not present

## 2020-07-15 DIAGNOSIS — R748 Abnormal levels of other serum enzymes: Secondary | ICD-10-CM | POA: Diagnosis not present

## 2020-07-15 DIAGNOSIS — E669 Obesity, unspecified: Secondary | ICD-10-CM | POA: Diagnosis not present

## 2020-07-25 DIAGNOSIS — E669 Obesity, unspecified: Secondary | ICD-10-CM | POA: Diagnosis not present

## 2020-07-25 DIAGNOSIS — Z723 Lack of physical exercise: Secondary | ICD-10-CM | POA: Diagnosis not present

## 2020-08-04 DIAGNOSIS — K760 Fatty (change of) liver, not elsewhere classified: Secondary | ICD-10-CM | POA: Diagnosis not present

## 2020-08-04 DIAGNOSIS — R160 Hepatomegaly, not elsewhere classified: Secondary | ICD-10-CM | POA: Diagnosis not present

## 2020-08-04 DIAGNOSIS — E559 Vitamin D deficiency, unspecified: Secondary | ICD-10-CM | POA: Diagnosis not present

## 2020-08-04 DIAGNOSIS — R748 Abnormal levels of other serum enzymes: Secondary | ICD-10-CM | POA: Diagnosis not present

## 2020-08-05 DIAGNOSIS — Z713 Dietary counseling and surveillance: Secondary | ICD-10-CM | POA: Diagnosis not present

## 2020-08-05 DIAGNOSIS — E669 Obesity, unspecified: Secondary | ICD-10-CM | POA: Diagnosis not present

## 2020-08-11 DIAGNOSIS — Z01818 Encounter for other preprocedural examination: Secondary | ICD-10-CM | POA: Diagnosis not present

## 2020-08-11 DIAGNOSIS — I10 Essential (primary) hypertension: Secondary | ICD-10-CM | POA: Diagnosis not present

## 2020-08-11 DIAGNOSIS — E669 Obesity, unspecified: Secondary | ICD-10-CM | POA: Diagnosis not present

## 2020-08-30 ENCOUNTER — Ambulatory Visit (HOSPITAL_BASED_OUTPATIENT_CLINIC_OR_DEPARTMENT_OTHER): Payer: Federal, State, Local not specified - PPO | Admitting: Cardiology

## 2020-08-30 ENCOUNTER — Other Ambulatory Visit: Payer: Self-pay

## 2020-08-30 VITALS — BP 138/90 | HR 71 | Ht 60.0 in | Wt 204.0 lb

## 2020-08-30 DIAGNOSIS — Z7189 Other specified counseling: Secondary | ICD-10-CM | POA: Diagnosis not present

## 2020-08-30 DIAGNOSIS — Z6839 Body mass index (BMI) 39.0-39.9, adult: Secondary | ICD-10-CM

## 2020-08-30 DIAGNOSIS — I10 Essential (primary) hypertension: Secondary | ICD-10-CM | POA: Diagnosis not present

## 2020-08-30 DIAGNOSIS — E6609 Other obesity due to excess calories: Secondary | ICD-10-CM | POA: Diagnosis not present

## 2020-08-30 DIAGNOSIS — R002 Palpitations: Secondary | ICD-10-CM

## 2020-08-30 NOTE — Patient Instructions (Signed)
Medication Instructions:  Your Physician recommend you continue on your current medication as directed.    *If you need a refill on your cardiac medications before your next appointment, please call your pharmacy*   Lab Work: None ordered today   Testing/Procedures: None ordered today   Follow-Up: At [facility], you and your health needs are our priority.  As part of our continuing mission to provide you with exceptional heart care, we have created designated Provider Care Teams.  These Care Teams include your primary Cardiologist (physician) and Advanced Practice Providers (APPs -  Physician Assistants and Nurse Practitioners) who all work together to provide you with the care you need, when you need it.  We recommend signing up for the patient portal called "MyChart".  Sign up information is provided on this After Visit Summary.  MyChart is used to connect with patients for Virtual Visits (Telemedicine).  Patients are able to view lab/test results, encounter notes, upcoming appointments, etc.  Non-urgent messages can be sent to your provider as well.   To learn more about what you can do with MyChart, go to [email or web address].    Your next appointment:   2 month(s)  The format for your next appointment:   In Person  Provider:   Buford Dresser, MD   [place]: Website: [email or web address]  DR. Harrell Gave RECOMMENDS YOU PURCHASE  " Kardia" By [employer] FROM THE  [employer]  APP PLAY STORE.  THE APP IS FREE , BUT THE  EQUIPMENT HAS A COST. IT ALLOWS YOU TO OBTAIN A RECORDING OF YOUR HEART RATE AND RHYTHM BY PROVIDING A SHORT STRIP THAT YOU CAN SHARE WITH YOUR PROVIDER.

## 2020-08-30 NOTE — Progress Notes (Signed)
Cardiology Office Note:    Date:  08/31/2020   ID:  Kelly Keller, DOB 1980/02/15, MRN [id number]  PCP:  Linda Hedges, DO  Cardiologist:  Buford Dresser, MD  Referring MD: Kelly Huxley, PA   CC: new patient consultation for tachycardia  History of Present Illness:    Kelly Keller is a 40 y.o. female with hypertension, obesity who is seen as a new consult at the request of Kelly Keller, [place] for the evaluation and management of tachycardia.  Today: Tachycardia: Rapid heart rate -Initial onset: After beginning 30 mg dose of nifedipine, around 01/2020  -Frequency/Duration: About 5 times a week, Begins 2 hours after taking nifedipine -Associated symptoms: Heart rate spikes from 70 to 120 bpm. -Aggravating/alleviating factors: none -Syncope/near syncope: none -Prior cardiac history: None -Prior workup: none -Prior treatment: none -Possible medication interactions: labetalol (discontinued), nifedipine -Caffeine: Completely stopped drinking caffeine for 1 month now. -Alcohol: None due to liver issues. -Tobacco: Never smoker. -Comorbidities: borderline diabetes, obesity -Labs: TSH, kidney function/electrolytes, CBC reviewed. -Cardiac ROS: no chest pain, no shortness of breath, no PND, no orthopnea, no LE edema. -Family history: Mother, maternal grandmother, and maternal aunt all had heart attacks.  At home she does not typically check her blood pressure. Her blood pressure has been borderline high since her mid-30's. Up until 12/2019, her blood pressure was uncontrolled. First, her PCP prescribed labetalol. At 200 mg, after an hour of taking labetalol she felt like "she was going to die" and needed to lie down. Currently she is now taking 30 mg nifedipine, and she is experiencing a racing heart rate 2 hours after taking this medicine. While sitting, she has noticed her HR spike from 70 to 120.  Of note, she has bariatric surgery scheduled for 09/07/2020. She is nursing her 36  yo child so lactation needed to be considered for medications.  She denies any chest pain, or shortness of breath. No lightheadedness, headaches, syncope, orthopnea, or PND. Also has no lower extremity edema or exertional symptoms.    Past Medical History:  Diagnosis Date   Medical history non-contributory     Past Surgical History:  Procedure Laterality Date   CESAREAN SECTION N/A 10/25/2017   Procedure: CESAREAN SECTION;  Surgeon: Everlene Farrier, MD;  Location: [facility];  Service: Obstetrics;  Laterality: N/A;  Primary edc 01/06/18 allery to PCN, oxycodone Dr. Corinna Keller to assist   CHOLECYSTECTOMY     WISDOM TOOTH EXTRACTION Bilateral     Current Medications: Current Outpatient Medications on File Prior to Visit  Medication Sig   ALPRAZolam (XANAX) 0.25 MG tablet Take 1 tablet by mouth as needed.   escitalopram (LEXAPRO) 10 MG tablet Take 10 mg by mouth daily.   NIFEdipine (ADALAT CC) 30 MG 24 hr tablet Take 30 mg by mouth daily.   No current facility-administered medications on file prior to visit.     Allergies:   Penicillins and Tylenol with codeine #3 [acetaminophen-codeine]   Social History   Tobacco Use   Smoking status: Never   Smokeless tobacco: Never  Substance Use Topics   Alcohol use: Never   Drug use: Never    Family History: family history includes Heart attack in her mother.  ROS:   Please see the history of present illness.  Additional pertinent ROS: Constitutional: Negative for chills, fever, night sweats, unintentional weight loss  HENT: Negative for ear pain and hearing loss.   Eyes: Negative for loss of vision and eye pain.  Respiratory:  Negative for cough, sputum, wheezing.   Cardiovascular: See HPI. Gastrointestinal: Negative for abdominal pain, melena, and hematochezia.  Genitourinary: Negative for dysuria and hematuria.  Musculoskeletal: Negative for falls and myalgias.  Skin: Negative for itching and rash.  Neurological: Negative  for focal weakness, focal sensory changes and loss of consciousness.  Endo/Heme/Allergies: Does not bruise/bleed easily.     EKGs/Labs/Other Studies Reviewed:    The following studies were reviewed today: No prior CV studies available.  EKG:  EKG is personally reviewed.   08/30/2020: NSR at 77 bpm  Recent Labs: 09/21/2019: BUN 12; Creatinine, Ser 0.64; Hemoglobin 13.6; Platelets 192; Potassium 3.9; Sodium 136  Recent Lipid Panel No results found for: CHOL, TRIG, HDL, CHOLHDL, VLDL, LDLCALC, LDLDIRECT  Physical Exam:    VS:  BP 138/90   Pulse 71   Ht 5' (1.524 m)   Wt 204 lb (92.5 kg)   BMI 39.84 kg/m     Wt Readings from Last 3 Encounters:  08/30/20 204 lb (92.5 kg)  09/21/19 199 lb 4.8 oz (90.4 kg)  10/25/17 208 lb (94.3 kg)    GEN: Well nourished, well developed in no acute distress HEENT: Normal, moist mucous membranes NECK: No JVD CARDIAC: regular rhythm, normal S1 and S2, no rubs or gallops. No murmur. VASCULAR: Radial and DP pulses 2+ bilaterally. No carotid bruits RESPIRATORY:  Clear to auscultation without rales, wheezing or rhonchi  ABDOMEN: Soft, non-tender, non-distended MUSCULOSKELETAL:  Ambulates independently SKIN: Warm and dry, no edema NEUROLOGIC:  Alert and oriented x 3. No focal neuro deficits noted. PSYCHIATRIC:  Normal affect    ASSESSMENT:    1. Palpitation   2. Essential hypertension   3. Class 2 obesity due to excess calories without serious comorbidity with body mass index (BMI) of 39.0 to 39.9 in adult   4. Cardiac risk counseling   5. Counseling on health promotion and disease prevention    PLAN:    Hypertension Class 2 obesity with complication, BMI [id number] Intermittent palpitations/tachycardia -normal ECG -we discussed evaluation of symptoms. Given that she is going to have bariatric surgery in the very near future, and therefore will likely be coming off nifedipine, we elected to not pursue evaluation at this time -once she is  recovering post op, she will log symptoms and we will try to find a pattern -we discussed K[employer]obile today. Can discuss a monitor post surgery if symptoms persist  Cardiac risk counseling and prevention recommendations: -recommend heart healthy/Mediterranean diet, with whole grains, fruits, vegetable, fish, lean meats, nuts, and olive oil. Limit salt. -recommend moderate walking, 3-5 times/week for 30-50 minutes each session. Aim for at least 150 minutes.week. Goal should be pace of 3 miles/hours, or walking 1.5 miles in 30 minutes -recommend avoidance of tobacco products. Avoid excess alcohol. -ASCVD risk score: The ASCVD Risk score Mikey Bussing DC Jr., et al., 2013) failed to calculate for the following reasons:   The 2013 ASCVD risk score is only valid for ages 46 to 70    Plan for follow up: 2 months or sooner as needed.  Buford Dresser, MD, PhD, [facility]    Medication Adjustments/Labs and Tests Ordered: Current medicines are reviewed at length with the patient today.  Concerns regarding medicines are outlined above.  Orders Placed This Encounter  Procedures   EKG 12-Lead   No orders of the defined types were placed in this encounter.  Patient Instructions  Medication Instructions:  Your Physician recommend you continue on your current medication  as directed.    *If you need a refill on your cardiac medications before your next appointment, please call your pharmacy*   Lab Work: None ordered today   Testing/Procedures: None ordered today   Follow-Up: At [facility], you and your health needs are our priority.  As part of our continuing mission to provide you with exceptional heart care, we have created designated Provider Care Teams.  These Care Teams include your primary Cardiologist (physician) and Advanced Practice Providers (APPs -  Physician Assistants and Nurse Practitioners) who all work together to provide you with the care you need,  when you need it.  We recommend signing up for the patient portal called "MyChart".  Sign up information is provided on this After Visit Summary.  MyChart is used to connect with patients for Virtual Visits (Telemedicine).  Patients are able to view lab/test results, encounter notes, upcoming appointments, etc.  Non-urgent messages can be sent to your provider as well.   To learn more about what you can do with MyChart, go to [email or web address].    Your next appointment:   2 month(s)  The format for your next appointment:   In Person  Provider:   Buford Dresser, MD   [place]: Website: [email or web address]  DR. Harrell Gave RECOMMENDS YOU PURCHASE  " Kardia" By [employer]  [employer] FROM THE  [employer]  APP PLAY STORE.  THE APP IS FREE , BUT THE  EQUIPMENT HAS A COST. IT ALLOWS YOU TO OBTAIN A RECORDING OF YOUR HEART RATE AND RHYTHM BY PROVIDING A SHORT STRIP THAT YOU CAN SHARE WITH YOUR PROVIDER.     I,Mathew Stumpf,acting as a Education administrator for [employer], MD.,have documented all relevant documentation on the behalf of Buford Dresser, MD,as directed by  Buford Dresser, MD while in the presence of Buford Dresser, MD.  I, Buford Dresser, MD, have reviewed all documentation for this visit. The documentation on 08/31/20 for the exam, diagnosis, procedures, and orders are all accurate and complete.   Signed, Buford Dresser, MD PhD 08/31/2020 11:22 AM    [place]

## 2020-08-31 ENCOUNTER — Encounter (HOSPITAL_BASED_OUTPATIENT_CLINIC_OR_DEPARTMENT_OTHER): Payer: Self-pay | Admitting: Cardiology

## 2020-09-07 DIAGNOSIS — Z79899 Other long term (current) drug therapy: Secondary | ICD-10-CM | POA: Diagnosis not present

## 2020-09-07 DIAGNOSIS — F419 Anxiety disorder, unspecified: Secondary | ICD-10-CM | POA: Diagnosis not present

## 2020-09-07 DIAGNOSIS — E785 Hyperlipidemia, unspecified: Secondary | ICD-10-CM | POA: Diagnosis not present

## 2020-09-07 DIAGNOSIS — Z6838 Body mass index (BMI) 38.0-38.9, adult: Secondary | ICD-10-CM | POA: Diagnosis not present

## 2020-09-07 DIAGNOSIS — Z885 Allergy status to narcotic agent status: Secondary | ICD-10-CM | POA: Diagnosis not present

## 2020-09-07 DIAGNOSIS — K769 Liver disease, unspecified: Secondary | ICD-10-CM | POA: Diagnosis not present

## 2020-09-07 DIAGNOSIS — K219 Gastro-esophageal reflux disease without esophagitis: Secondary | ICD-10-CM | POA: Diagnosis not present

## 2020-09-07 DIAGNOSIS — Z880 Allergy status to penicillin: Secondary | ICD-10-CM | POA: Diagnosis not present

## 2020-09-07 DIAGNOSIS — F32A Depression, unspecified: Secondary | ICD-10-CM | POA: Diagnosis not present

## 2020-09-07 DIAGNOSIS — K760 Fatty (change of) liver, not elsewhere classified: Secondary | ICD-10-CM | POA: Diagnosis not present

## 2020-09-07 DIAGNOSIS — I10 Essential (primary) hypertension: Secondary | ICD-10-CM | POA: Diagnosis not present

## 2020-09-07 DIAGNOSIS — J45909 Unspecified asthma, uncomplicated: Secondary | ICD-10-CM | POA: Diagnosis not present

## 2020-09-07 DIAGNOSIS — R7401 Elevation of levels of liver transaminase levels: Secondary | ICD-10-CM | POA: Diagnosis not present

## 2020-09-07 DIAGNOSIS — R748 Abnormal levels of other serum enzymes: Secondary | ICD-10-CM | POA: Diagnosis not present

## 2020-09-07 DIAGNOSIS — G4733 Obstructive sleep apnea (adult) (pediatric): Secondary | ICD-10-CM | POA: Diagnosis not present

## 2020-09-07 DIAGNOSIS — E559 Vitamin D deficiency, unspecified: Secondary | ICD-10-CM | POA: Diagnosis not present

## 2020-09-07 HISTORY — PX: LAPAROSCOPIC GASTRIC SLEEVE RESECTION: SHX5895

## 2020-09-14 DIAGNOSIS — R112 Nausea with vomiting, unspecified: Secondary | ICD-10-CM | POA: Diagnosis not present

## 2020-09-14 DIAGNOSIS — Z9889 Other specified postprocedural states: Secondary | ICD-10-CM | POA: Diagnosis not present

## 2020-09-26 DIAGNOSIS — R112 Nausea with vomiting, unspecified: Secondary | ICD-10-CM | POA: Diagnosis not present

## 2020-09-26 DIAGNOSIS — Z9889 Other specified postprocedural states: Secondary | ICD-10-CM | POA: Diagnosis not present

## 2020-10-12 DIAGNOSIS — R197 Diarrhea, unspecified: Secondary | ICD-10-CM | POA: Diagnosis not present

## 2020-10-18 DIAGNOSIS — A0472 Enterocolitis due to Clostridium difficile, not specified as recurrent: Secondary | ICD-10-CM | POA: Diagnosis not present

## 2020-10-18 DIAGNOSIS — E559 Vitamin D deficiency, unspecified: Secondary | ICD-10-CM | POA: Diagnosis not present

## 2020-10-18 DIAGNOSIS — E669 Obesity, unspecified: Secondary | ICD-10-CM | POA: Diagnosis not present

## 2020-10-18 DIAGNOSIS — R748 Abnormal levels of other serum enzymes: Secondary | ICD-10-CM | POA: Diagnosis not present

## 2020-10-19 DIAGNOSIS — Z903 Acquired absence of stomach [part of]: Secondary | ICD-10-CM | POA: Diagnosis not present

## 2020-10-19 DIAGNOSIS — Z713 Dietary counseling and surveillance: Secondary | ICD-10-CM | POA: Diagnosis not present

## 2020-10-19 DIAGNOSIS — E669 Obesity, unspecified: Secondary | ICD-10-CM | POA: Diagnosis not present

## 2020-11-01 DIAGNOSIS — Z9049 Acquired absence of other specified parts of digestive tract: Secondary | ICD-10-CM | POA: Diagnosis not present

## 2020-11-01 DIAGNOSIS — R748 Abnormal levels of other serum enzymes: Secondary | ICD-10-CM | POA: Diagnosis not present

## 2020-11-01 DIAGNOSIS — K7689 Other specified diseases of liver: Secondary | ICD-10-CM | POA: Diagnosis not present

## 2020-11-09 ENCOUNTER — Ambulatory Visit (HOSPITAL_BASED_OUTPATIENT_CLINIC_OR_DEPARTMENT_OTHER): Payer: Federal, State, Local not specified - PPO | Admitting: Cardiology

## 2020-11-09 ENCOUNTER — Encounter (HOSPITAL_BASED_OUTPATIENT_CLINIC_OR_DEPARTMENT_OTHER): Payer: Self-pay | Admitting: Cardiology

## 2020-11-09 ENCOUNTER — Other Ambulatory Visit: Payer: Self-pay

## 2020-11-09 VITALS — BP 134/92 | HR 79 | Ht 60.0 in | Wt 160.0 lb

## 2020-11-09 DIAGNOSIS — E6609 Other obesity due to excess calories: Secondary | ICD-10-CM

## 2020-11-09 DIAGNOSIS — Z7189 Other specified counseling: Secondary | ICD-10-CM | POA: Diagnosis not present

## 2020-11-09 DIAGNOSIS — I10 Essential (primary) hypertension: Secondary | ICD-10-CM | POA: Diagnosis not present

## 2020-11-09 DIAGNOSIS — Z6839 Body mass index (BMI) 39.0-39.9, adult: Secondary | ICD-10-CM

## 2020-11-09 DIAGNOSIS — R002 Palpitations: Secondary | ICD-10-CM

## 2020-11-09 NOTE — Patient Instructions (Signed)
Medication Instructions:  No cardiac medications needed at this time.  Lab Work: None at this time.  Testing/Procedures: None  Follow-Up: At [facility], you and your health needs are our priority.  As part of our continuing mission to provide you with exceptional heart care, we have created designated Provider Care Teams.  These Care Teams include your primary Cardiologist (physician) and Advanced Practice Providers (APPs -  Physician Assistants and Nurse Practitioners) who all work together to provide you with the care you need, when you need it.  We recommend signing up for the patient portal called "MyChart".  Sign up information is provided on this After Visit Summary.  MyChart is used to connect with patients for Virtual Visits (Telemedicine).  Patients are able to view lab/test results, encounter notes, upcoming appointments, etc.  Non-urgent messages can be sent to your provider as well.   To learn more about what you can do with MyChart, go to [email or web address].    Your next appointment:   As needed  The format for your next appointment:   In Person  Provider:   Buford Dresser, MD   Other Instructions Please feel free to reach out in the future if you have any concerns.

## 2020-11-09 NOTE — Progress Notes (Signed)
Cardiology Office Note:    Date:  11/28/2020   ID:  Kelly Keller, DOB 1980-03-28, MRN [id number]  PCP:  Lois Huxley, PA  Cardiologist:  Buford Dresser, MD  Referring MD: Linda Hedges, DO   CC: Follow-up  History of Present Illness:    Kelly Keller is a 40 y.o. female with hypertension, obesity who is seen for follow-up. I initially met her 08/30/2020 as a new consult at the request of Linda Hedges, DO for the evaluation and management of tachycardia.  -Comorbidities: borderline diabetes, obesity -Family history: Mother, maternal grandmother, and maternal aunt all had heart attacks.  Today: Overall she is feeling well. Since her last visit she has recovered well from her bariatric surgery and lost 40 pounds. Unfortunately, while in the hospital she developed a C. difficile infection, which has resolved.  She denies any palpitations, chest pain, or shortness of breath. No lightheadedness, headaches, syncope, orthopnea, or PND. Also has no lower extremity edema or exertional symptoms.   Past Medical History:  Diagnosis Date   Medical history non-contributory     Past Surgical History:  Procedure Laterality Date   CESAREAN SECTION N/A 10/25/2017   Procedure: CESAREAN SECTION;  Surgeon: Everlene Farrier, MD;  Location: [facility];  Service: Obstetrics;  Laterality: N/A;  Primary edc 01/06/18 allery to PCN, oxycodone Dr. Corinna Capra to assist   CHOLECYSTECTOMY     WISDOM TOOTH EXTRACTION Bilateral     Current Medications: Current Outpatient Medications on File Prior to Visit  Medication Sig   ALPRAZolam (XANAX) 0.25 MG tablet Take 1 tablet by mouth as needed.   Biotin 1000 MCG CHEW Chew by mouth.   calcium carbonate (OS-CAL) 1250 (500 Ca) MG chewable tablet Chew by mouth.   escitalopram (LEXAPRO) 10 MG tablet Take 10 mg by mouth daily.   Multiple Vitamins-Minerals (MULTIVITAMIN WITH MINERALS) tablet Take 1 tablet by mouth daily.   omeprazole (PRILOSEC) 40 MG  capsule Take 40 mg by mouth daily.   Specialty Vitamins Products (WEIGHT LOSS DAILY MULTI PO)    VITAMIN D PO Take by mouth.   No current facility-administered medications on file prior to visit.     Allergies:   Penicillins and Tylenol with codeine #3 [acetaminophen-codeine]   Social History   Tobacco Use   Smoking status: Never   Smokeless tobacco: Never  Substance Use Topics   Alcohol use: Never   Drug use: Never    Family History: family history includes Heart attack in her mother.  ROS:   Please see the history of present illness. All other systems are reviewed and negative.    EKGs/Labs/Other Studies Reviewed:    The following studies were reviewed today: No prior CV studies available.  EKG:  EKG is personally reviewed.   11/09/2020: not ordered 08/30/2020: NSR at 77 bpm  Recent Labs: No results found for requested labs within last 8760 hours.  Recent Lipid Panel No results found for: CHOL, TRIG, HDL, CHOLHDL, VLDL, LDLCALC, LDLDIRECT  Physical Exam:    VS:  BP (!) 134/92   Pulse 79   Ht 5' (1.524 m)   Wt 160 lb (72.6 kg)   SpO2 98%   BMI 31.25 kg/m     Wt Readings from Last 3 Encounters:  11/09/20 160 lb (72.6 kg)  08/30/20 204 lb (92.5 kg)  09/21/19 199 lb 4.8 oz (90.4 kg)    GEN: Well nourished, well developed in no acute distress HEENT: Normal, moist mucous membranes NECK: No JVD CARDIAC:  regular rhythm, normal S1 and S2, no rubs or gallops. No murmur. VASCULAR: Radial and DP pulses 2+ bilaterally. No carotid bruits RESPIRATORY:  Clear to auscultation without rales, wheezing or rhonchi  ABDOMEN: Soft, non-tender, non-distended MUSCULOSKELETAL:  Ambulates independently SKIN: Warm and dry, no edema NEUROLOGIC:  Alert and oriented x 3. No focal neuro deficits noted. PSYCHIATRIC:  Normal affect    ASSESSMENT:    1. Essential hypertension   2. Class 2 obesity due to excess calories without serious comorbidity with body mass index (BMI) of 39.0  to 39.9 in adult   3. Palpitation   4. Cardiac risk counseling   5. Counseling on health promotion and disease prevention     PLAN:    Hypertension Class 2 obesity with complication, BMI 67.2 Intermittent palpitations/tachycardia -these are all resolved post bariatric surgery.  -I discussed what to watch for. If she has any recurrence of symptoms she will contact me.  Cardiac risk counseling and prevention recommendations: -recommend heart healthy/Mediterranean diet, with whole grains, fruits, vegetable, fish, lean meats, nuts, and olive oil. Limit salt. -recommend moderate walking, 3-5 times/week for 30-50 minutes each session. Aim for at least 150 minutes.week. Goal should be pace of 3 miles/hours, or walking 1.5 miles in 30 minutes -recommend avoidance of tobacco products. Avoid excess alcohol. -ASCVD risk score: The ASCVD Risk score (Arnett DK, et al., 2019) failed to calculate for the following reasons:   The 2019 ASCVD risk score is only valid for ages 33 to 36    Plan for follow up:  I would be happy to see her back as needed  Buford Dresser, MD, PhD, [facility]    Medication Adjustments/Labs and Tests Ordered: Current medicines are reviewed at length with the patient today.  Concerns regarding medicines are outlined above.  No orders of the defined types were placed in this encounter.  No orders of the defined types were placed in this encounter.  Patient Instructions  Medication Instructions:  No cardiac medications needed at this time.  Lab Work: None at this time.  Testing/Procedures: None  Follow-Up: At [facility], you and your health needs are our priority.  As part of our continuing mission to provide you with exceptional heart care, we have created designated Provider Care Teams.  These Care Teams include your primary Cardiologist (physician) and Advanced Practice Providers (APPs -  Physician Assistants and Nurse  Practitioners) who all work together to provide you with the care you need, when you need it.  We recommend signing up for the patient portal called "MyChart".  Sign up information is provided on this After Visit Summary.  MyChart is used to connect with patients for Virtual Visits (Telemedicine).  Patients are able to view lab/test results, encounter notes, upcoming appointments, etc.  Non-urgent messages can be sent to your provider as well.   To learn more about what you can do with MyChart, go to [email or web address].    Your next appointment:   As needed  The format for your next appointment:   In Person  Provider:   Buford Dresser, MD   Other Instructions Please feel free to reach out in the future if you have any concerns.    I,Mathew Stumpf,acting as a Education administrator for [employer], MD.,have documented all relevant documentation on the behalf of Buford Dresser, MD,as directed by  Buford Dresser, MD while in the presence of Buford Dresser, MD.  I, Buford Dresser, MD, have reviewed all documentation for this visit.  The documentation on 11/28/20 for the exam, diagnosis, procedures, and orders are all accurate and complete.   Signed, Buford Dresser, MD PhD 11/28/2020 11:24 AM    [place]

## 2020-11-28 ENCOUNTER — Encounter (HOSPITAL_BASED_OUTPATIENT_CLINIC_OR_DEPARTMENT_OTHER): Payer: Self-pay | Admitting: Cardiology

## 2020-12-07 DIAGNOSIS — E669 Obesity, unspecified: Secondary | ICD-10-CM | POA: Diagnosis not present

## 2020-12-07 DIAGNOSIS — Z713 Dietary counseling and surveillance: Secondary | ICD-10-CM | POA: Diagnosis not present

## 2020-12-07 DIAGNOSIS — Z903 Acquired absence of stomach [part of]: Secondary | ICD-10-CM | POA: Diagnosis not present

## 2021-03-06 DIAGNOSIS — K912 Postsurgical malabsorption, not elsewhere classified: Secondary | ICD-10-CM | POA: Diagnosis not present

## 2021-03-06 DIAGNOSIS — Z903 Acquired absence of stomach [part of]: Secondary | ICD-10-CM | POA: Diagnosis not present

## 2021-03-07 DIAGNOSIS — L821 Other seborrheic keratosis: Secondary | ICD-10-CM | POA: Diagnosis not present

## 2021-03-07 DIAGNOSIS — K625 Hemorrhage of anus and rectum: Secondary | ICD-10-CM | POA: Diagnosis not present

## 2021-03-07 DIAGNOSIS — D1801 Hemangioma of skin and subcutaneous tissue: Secondary | ICD-10-CM | POA: Diagnosis not present

## 2021-03-07 DIAGNOSIS — Z1211 Encounter for screening for malignant neoplasm of colon: Secondary | ICD-10-CM | POA: Diagnosis not present

## 2021-03-07 DIAGNOSIS — L814 Other melanin hyperpigmentation: Secondary | ICD-10-CM | POA: Diagnosis not present

## 2021-03-07 DIAGNOSIS — K219 Gastro-esophageal reflux disease without esophagitis: Secondary | ICD-10-CM | POA: Diagnosis not present

## 2021-03-07 DIAGNOSIS — L290 Pruritus ani: Secondary | ICD-10-CM | POA: Diagnosis not present

## 2021-03-07 DIAGNOSIS — L578 Other skin changes due to chronic exposure to nonionizing radiation: Secondary | ICD-10-CM | POA: Diagnosis not present

## 2021-03-09 IMAGING — CR DG CHEST 2V
2 series · 2 of 2 positions shown · non-contrast
Comparison: None.

CLINICAL DATA: Chest pain.

EXAM:
CHEST - 2 VIEW

[w chest pa]
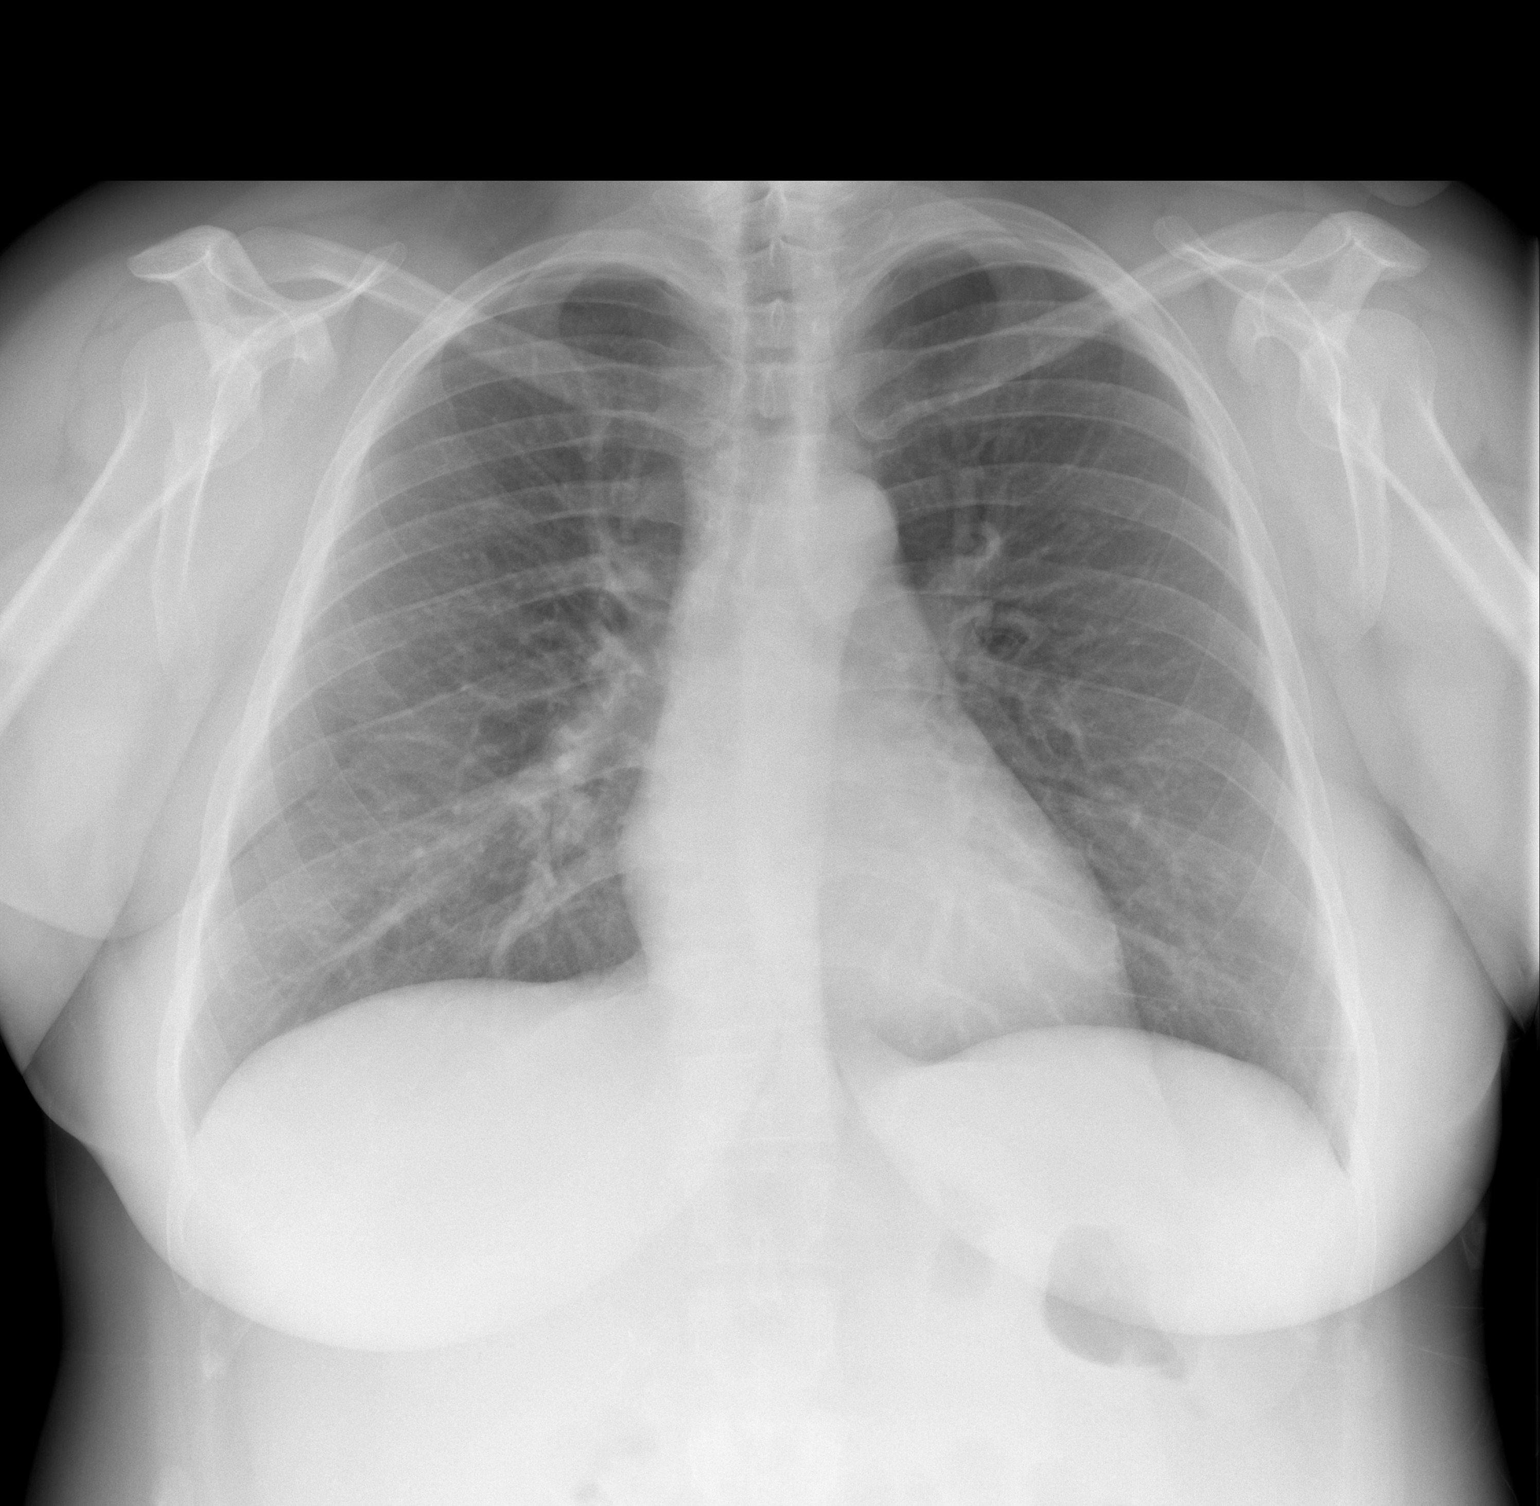

[w chest lat]
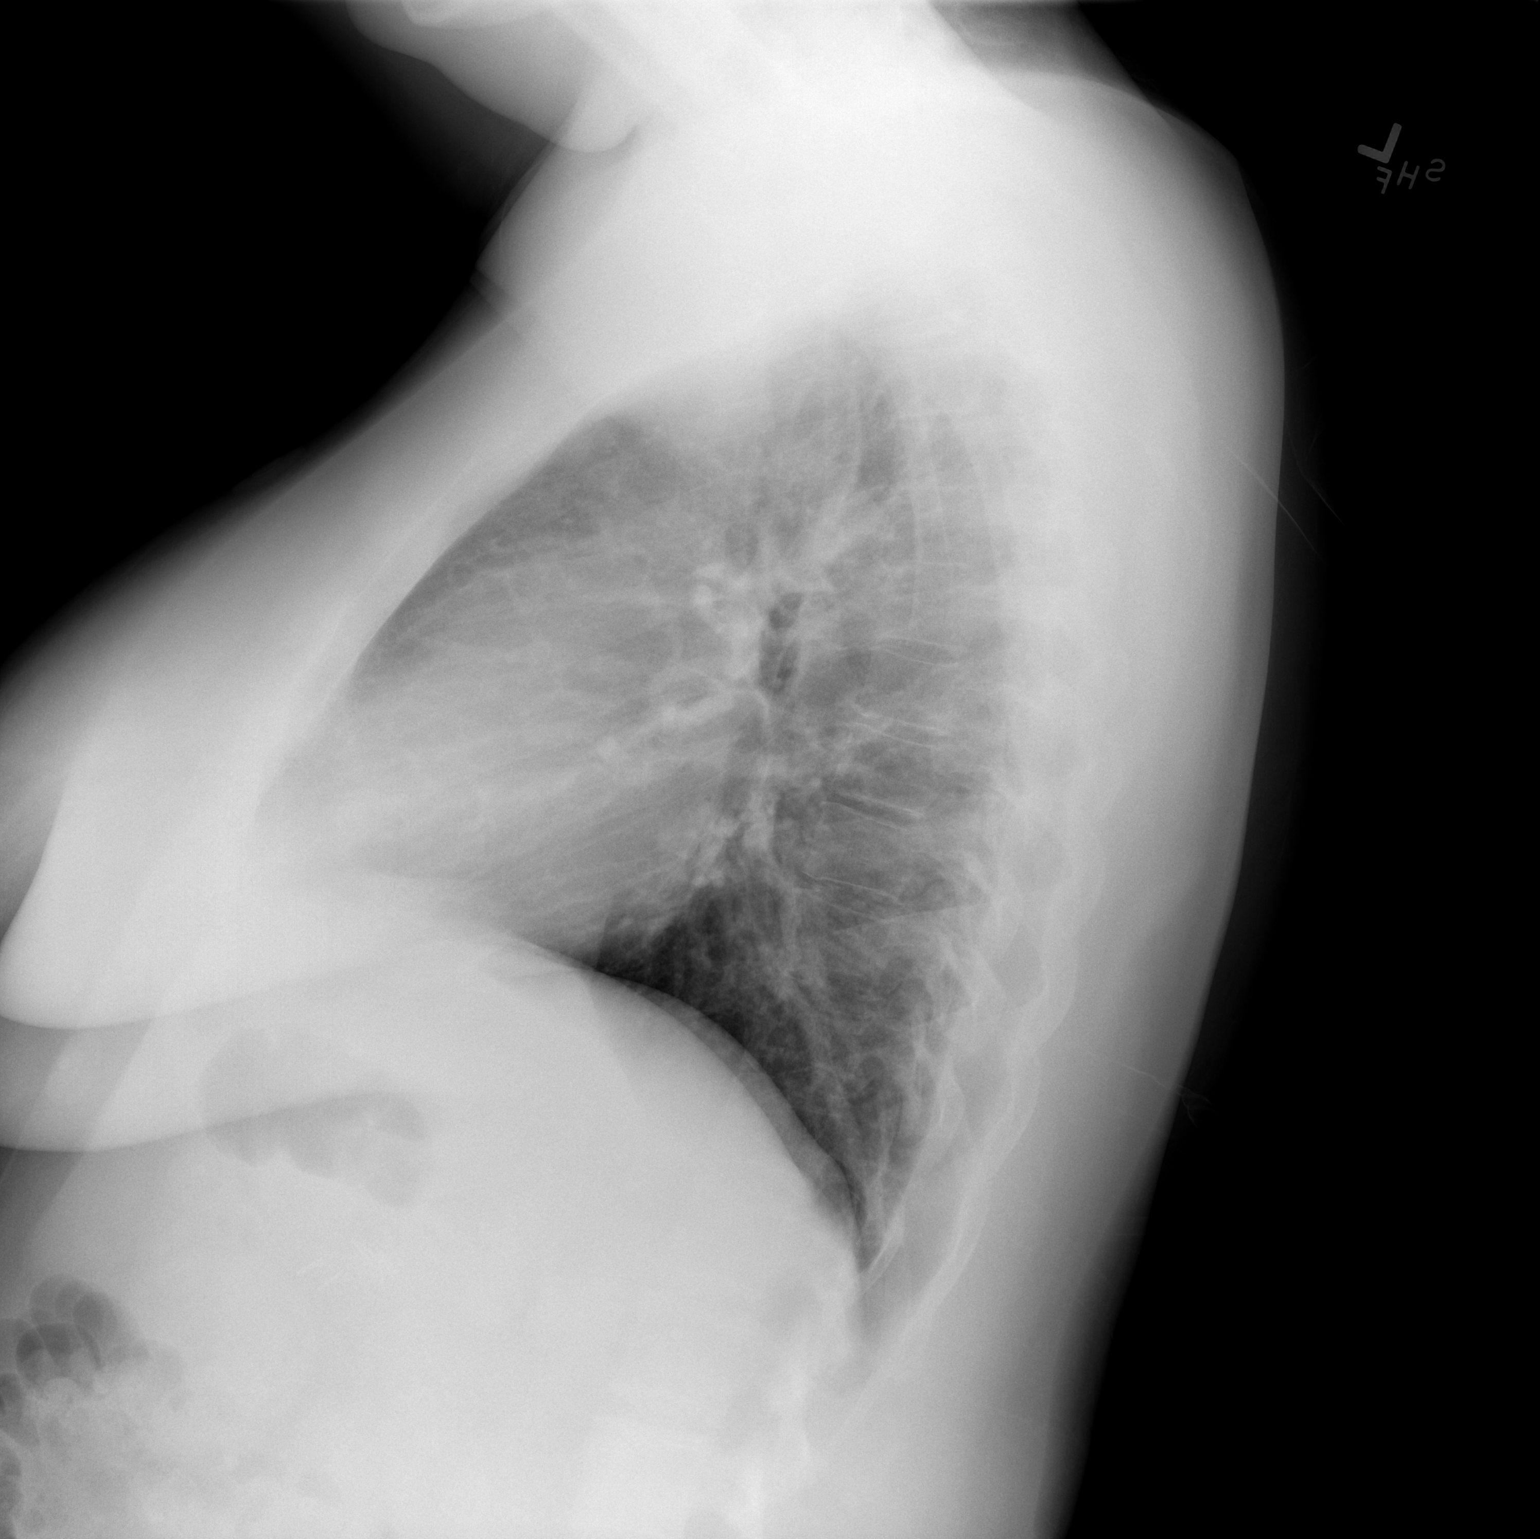

[2 of 2 positions shown; findings below may reference images not displayed]

FINDINGS: The heart size and mediastinal contours are within normal limits.
Both lungs are clear. No pneumothorax or pleural effusion is noted.
The visualized skeletal structures are unremarkable.
IMPRESSION: No active cardiopulmonary disease.

## 2021-03-21 DIAGNOSIS — Z903 Acquired absence of stomach [part of]: Secondary | ICD-10-CM | POA: Diagnosis not present

## 2021-03-21 DIAGNOSIS — K909 Intestinal malabsorption, unspecified: Secondary | ICD-10-CM | POA: Diagnosis not present

## 2021-04-07 DIAGNOSIS — Z1211 Encounter for screening for malignant neoplasm of colon: Secondary | ICD-10-CM | POA: Diagnosis not present

## 2021-04-07 DIAGNOSIS — K625 Hemorrhage of anus and rectum: Secondary | ICD-10-CM | POA: Diagnosis not present

## 2021-06-27 DIAGNOSIS — Z01419 Encounter for gynecological examination (general) (routine) without abnormal findings: Secondary | ICD-10-CM | POA: Diagnosis not present

## 2021-06-27 DIAGNOSIS — Z6828 Body mass index (BMI) 28.0-28.9, adult: Secondary | ICD-10-CM | POA: Diagnosis not present

## 2021-07-05 DIAGNOSIS — D259 Leiomyoma of uterus, unspecified: Secondary | ICD-10-CM | POA: Diagnosis not present

## 2021-07-05 DIAGNOSIS — Z1231 Encounter for screening mammogram for malignant neoplasm of breast: Secondary | ICD-10-CM | POA: Diagnosis not present

## 2021-09-15 DIAGNOSIS — K912 Postsurgical malabsorption, not elsewhere classified: Secondary | ICD-10-CM | POA: Diagnosis not present

## 2021-09-15 DIAGNOSIS — Z903 Acquired absence of stomach [part of]: Secondary | ICD-10-CM | POA: Diagnosis not present

## 2021-09-29 DIAGNOSIS — Z903 Acquired absence of stomach [part of]: Secondary | ICD-10-CM | POA: Diagnosis not present

## 2021-09-29 DIAGNOSIS — K912 Postsurgical malabsorption, not elsewhere classified: Secondary | ICD-10-CM | POA: Diagnosis not present

## 2021-10-26 DIAGNOSIS — I10 Essential (primary) hypertension: Secondary | ICD-10-CM | POA: Diagnosis not present

## 2021-10-26 DIAGNOSIS — E559 Vitamin D deficiency, unspecified: Secondary | ICD-10-CM | POA: Diagnosis not present

## 2021-10-26 DIAGNOSIS — E7800 Pure hypercholesterolemia, unspecified: Secondary | ICD-10-CM | POA: Diagnosis not present

## 2021-10-26 DIAGNOSIS — F411 Generalized anxiety disorder: Secondary | ICD-10-CM | POA: Diagnosis not present

## 2022-01-18 DIAGNOSIS — U071 COVID-19: Secondary | ICD-10-CM | POA: Diagnosis not present

## 2022-03-05 ENCOUNTER — Encounter (HOSPITAL_BASED_OUTPATIENT_CLINIC_OR_DEPARTMENT_OTHER): Payer: Self-pay | Admitting: Obstetrics & Gynecology

## 2022-03-05 ENCOUNTER — Other Ambulatory Visit: Payer: Self-pay

## 2022-03-05 DIAGNOSIS — O4413 Placenta previa with hemorrhage, third trimester: Secondary | ICD-10-CM | POA: Diagnosis not present

## 2022-03-05 DIAGNOSIS — Z01812 Encounter for preprocedural laboratory examination: Secondary | ICD-10-CM | POA: Diagnosis not present

## 2022-03-05 NOTE — Progress Notes (Signed)
Spoke w/ via phone for pre-op interview---Kelly Keller needs dos---- urine pregnancy per anesthesia, surgeon orders pending as of 03/05/22            Keller results------03/16/2022 Keller appt for cbc, type & screen COVID test -----patient states asymptomatic no test needed Arrive at -------0530 on Monday, 03/19/22 NPO after MN NO Solid Food.  Clear liquids from MN until---0430 Med rec completed Medications to take morning of surgery -----Xanax prn, Lexapro, Prilosec Diabetic medication -----n/a Patient instructed no nail polish to be worn day of surgery Patient instructed to bring photo id and insurance card day of surgery Patient aware to have Driver (ride ) / caregiver    for 24 hours after surgery - husband, Kelly Keller Patient Special Instructions -----Extended / overnight stay instructions given. Pre-Op special Istructions -----Requested orders from Dr. Lynnette Caffey via Epic IB on 03/05/22. Patient verbalized understanding of instructions that were given at this phone interview. Patient denies shortness of breath, chest pain, fever, cough at this phone interview.

## 2022-03-05 NOTE — Progress Notes (Signed)
Your procedure is scheduled on Monday, 03/19/22.  Report to [facility] AT  5:30  AM.   Call this number if you have problems the morning of surgery  [phone].   OUR ADDRESS IS [address].  WE ARE LOCATED IN [facility].  PLEASE BRING YOUR INSURANCE CARD AND PHOTO ID DAY OF SURGERY.  ONLY 2 PEOPLE ARE ALLOWED IN  WAITING  ROOM / CURRENTLY NO ONE UNDER AGE 41                                     REMEMBER:  DO NOT EAT FOOD, CANDY GUM OR MINTS  AFTER MIDNIGHT THE NIGHT BEFORE YOUR SURGERY . YOU MAY HAVE CLEAR LIQUIDS FROM MIDNIGHT THE NIGHT BEFORE YOUR SURGERY UNTIL  4:30 AM. NO CLEAR LIQUIDS AFTER   4:30 AM DAY OF SURGERY.  YOU MAY  BRUSH YOUR TEETH MORNING OF SURGERY AND RINSE YOUR MOUTH OUT, NO CHEWING GUM CANDY OR MINTS.     CLEAR LIQUID DIET    Allowed      Water                                                                   Coffee and tea, regular and decaf  (NO cream or milk products of any type, may sweeten)                         Carbonated beverages, regular and diet                                    Sports drinks like Gatorade _____________________________________________________________________     TAKE ONLY THESE MEDICATIONS MORNING OF SURGERY: Xanax if needed, Lexapro, Prilosec    UP TO 4 VISITORS  MAY VISIT IN THE EXTENDED RECOVERY ROOM UNTIL 800 PM ONLY.  ONE  VISITOR AGE 69 AND OVER MAY SPEND THE NIGHT AND MUST BE IN EXTENDED RECOVERY ROOM NO LATER THAN 800 PM . YOUR DISCHARGE TIME AFTER YOU SPEND THE NIGHT IS 900 AM THE MORNING AFTER YOUR SURGERY.  YOU MAY PACK A SMALL OVERNIGHT BAG WITH TOILETRIES FOR YOUR OVERNIGHT STAY IF YOU WISH.  YOUR PRESCRIPTION MEDICATIONS WILL BE PROVIDED DURING [place].                                      DO NOT WEAR JEWERLY/  METAL/  PIERCINGS (INCLUDING NO PLASTIC PIERCINGS) DO NOT WEAR LOTIONS, POWDERS, PERFUMES OR NAIL POLISH ON YOUR FINGERNAILS. TOENAIL  POLISH IS OK TO WEAR. DO NOT SHAVE FOR 48 HOURS PRIOR TO DAY OF SURGERY.  CONTACTS, GLASSES, OR DENTURES MAY NOT BE WORN TO SURGERY.  REMEMBER: NO SMOKING, VAPING ,  DRUGS OR ALCOHOL FOR 24 HOURS BEFORE YOUR SURGERY.  [facility] IS NOT RESPONSIBLE  FOR ANY BELONGINGS.                                                                    Marland Kitchen           [facility] - Preparing for Surgery Before surgery, you can play an important role.  Because skin is not sterile, your skin needs to be as free of germs as possible.  You can reduce the number of germs on your skin by washing with CHG (chlorahexidine gluconate) soap before surgery.  CHG is an antiseptic cleaner which kills germs and bonds with the skin to continue killing germs even after washing. Please DO NOT use if you have an allergy to CHG or antibacterial soaps.  If your skin becomes reddened/irritated stop using the CHG and inform your nurse when you arrive at Short Stay. Do not shave (including legs and underarms) for at least 48 hours prior to the first CHG shower.  You may shave your face/neck. Please follow these instructions carefully:  1.  Shower with CHG Soap the night before surgery and the  morning of Surgery.  2.  If you choose to wash your hair, wash your hair first as usual with your  normal  shampoo.  3.  After you shampoo, rinse your hair and body thoroughly to remove the  shampoo.                                        4.  Use CHG as you would any other liquid soap.  You can apply chg directly  to the skin and wash , chg soap provided, night before and morning of your surgery.  5.  Apply the CHG Soap to your body ONLY FROM THE NECK DOWN.   Do not use on face/ open                           Wound or open sores. Avoid contact with eyes, ears mouth and genitals (private parts).                       Wash face,  Genitals (private parts) with your normal soap.             6.  Wash thoroughly, paying  special attention to the area where your surgery  will be performed.  7.  Thoroughly rinse your body with warm water from the neck down.  8.  DO NOT shower/wash with your normal soap after using and rinsing off  the CHG Soap.             9.  Pat yourself dry with a clean towel.            10.  Wear clean pajamas.            11.  Place clean sheets on your bed the night of your first shower and do not  sleep with pets. Day of Surgery : Do not apply any lotions/ powders the morning of surgery.  Please wear clean clothes to the hospital/surgery  center.  IF YOU HAVE ANY SKIN IRRITATION OR PROBLEMS WITH THE SURGICAL SOAP, PLEASE GET A BAR OF GOLD DIAL SOAP AND SHOWER THE NIGHT BEFORE YOUR SURGERY AND THE MORNING OF YOUR SURGERY. PLEASE LET THE NURSE KNOW MORNING OF YOUR SURGERY IF YOU HAD ANY PROBLEMS WITH THE SURGICAL SOAP.   ________________________________________________________________________                                                        QUESTIONS [place] [facility] PRE OP NURSE PHONE [phone].

## 2022-03-14 DIAGNOSIS — N920 Excessive and frequent menstruation with regular cycle: Secondary | ICD-10-CM | POA: Diagnosis not present

## 2022-03-15 NOTE — H&P (Signed)
Kelly Keller is an 42 y.o. female with menorrhagia and dysmenorrhea.  Last u/s on 07/05/21 showed uterine volume 206 cc with IM fibroid 39m and SS fibroids: 183mand 11 mm.  Patient declined hormonal treatment and desires definitive management. Of note, patient has had SVD x 2 and one C/S.  She had sleeve gastrectomy 2022 and has lost over 50 lbs.  Pertinent Gynecological History: Menses: flow is excessive with use of 8 pads or tampons on heaviest days Bleeding: regular Contraception: vasectomy DES exposure: unknown Blood transfusions: none Sexually transmitted diseases: no past history Previous GYN Procedures:  C/S x 1   Last mammogram: normal Date: 07/05/21 Last pap: normal Date: 07/18/17 OB History: G3, P2103   Menstrual History: Menarche age: n/a Patient's last menstrual period was 02/07/2022 (exact date).    Past Medical History:  Diagnosis Date   Anemia    taking iron supplements   Anxiety    Follows w/ PCP.   Depression    Follows w/ PCP, Dr. [place]nne [place]rivers [place]  Fibroids    uterine   History of hypertension    resolved after gastric sleeve surgery   Tachycardia    resolved after gastric sleeve study   Wears glasses     Past Surgical History:  Procedure Laterality Date   CESAREAN SECTION N/A 10/25/2017   Procedure: CESAREAN SECTION;  Surgeon: ToEverlene FarrierMD;  [facility]: [facility];  Laterality: N/A;  Primary edc 01/06/18 allery to PCN, oxycodone Dr. LoCorinna Caprao assist   CHOLECYSTECTOMY  2010   LAPAROSCOPIC GASTRIC SLEEVE RESECTION  09/07/2020   w/ liver biopsy = showed mild steatosis   WISDOM TOOTH EXTRACTION Bilateral     Family History  Problem Relation Age of Onset   Heart attack Mother     Social History:  reports that she has never smoked. She has never used smokeless tobacco. She reports current alcohol use. She reports that she does not use drugs.  Allergies:  Allergies  Allergen Reactions   Penicillins  Rash    Has patient had a PCN reaction causing immediate rash, facial/tongue/throat swelling, SOB or lightheadedness with hypotension: unknown Has patient had a PCN reaction causing severe rash involving mucus membranes or skin necrosis: unknown Has patient had a PCN reaction that required hospitalization: No Has patient had a PCN reaction occurring within the last 10 years: No If all of the above answers are "NO", then may proceed with Cephalosporin use.    Tylenol With Codeine #3 [Acetaminophen-Codeine] Rash    Only allergic to codeine, not Tylenol.    No medications prior to admission.    Review of Systems  Height 5' 1"$  (1.549 m), weight 63.5 kg, last menstrual period 02/07/2022, not currently breastfeeding. Physical Exam Constitutional:      Appearance: Normal appearance.  HENT:     Head: Normocephalic and atraumatic.  Pulmonary:     Effort: Pulmonary effort is normal.  Abdominal:     Palpations: Abdomen is soft.  Musculoskeletal:        General: Normal range of motion.     Cervical back: Normal range of motion.  Skin:    General: Skin is warm and dry.  Neurological:     Mental Status: She is alert and oriented to person, place, and time.  Psychiatric:        Mood and Affect: Mood normal.        Behavior: Behavior normal.     No results found  for this or any previous visit (from the past 24 hour(s)).  No results found.  Assessment/Plan: [id number] with menorrhagia, dysmenorrhea desiring definitive management -LAVH, BS -Patient is counseled re: risk of bleeding, infection, scarring and damage to surrounding structures. She is informed of possible need to remove ovary/ovaries for visible abnormality or bleeding. She is also informed of possible need to convert to abdominal procedure if unable to safely complete laparoscopically. She is informed of steps of procedure as well as postop expectations and limitations. All questions were answered and patient wishes to  proceed.  Linda Hedges 03/15/2022, 3:40 PM

## 2022-03-16 ENCOUNTER — Encounter (HOSPITAL_COMMUNITY)
Admission: RE | Admit: 2022-03-16 | Discharge: 2022-03-16 | Disposition: A | Discharge status: Home / Self Care | Payer: Federal, State, Local not specified - PPO | Source: Ambulatory Visit | Attending: Family Medicine | Admitting: Family Medicine

## 2022-03-16 DIAGNOSIS — Z01818 Encounter for other preprocedural examination: Secondary | ICD-10-CM

## 2022-03-16 DIAGNOSIS — Z01812 Encounter for preprocedural laboratory examination: Secondary | ICD-10-CM | POA: Insufficient documentation

## 2022-03-16 DIAGNOSIS — O4410 Placenta previa with hemorrhage, unspecified trimester: Secondary | ICD-10-CM

## 2022-03-16 DIAGNOSIS — O4413 Placenta previa with hemorrhage, third trimester: Secondary | ICD-10-CM | POA: Insufficient documentation

## 2022-03-16 LAB — CBC
HCT: 38.4 % (ref 36.0–46.0)
Hemoglobin: 12.6 g/dL (ref 12.0–15.0)
MCH: 29.9 pg (ref 26.0–34.0)
MCHC: 32.8 g/dL (ref 30.0–36.0)
MCV: 91.2 fL (ref 80.0–100.0)
Platelets: 208 10*3/uL (ref 150–400)
RBC: 4.21 MIL/uL (ref 3.87–5.11)
RDW: 11.8 % (ref 11.5–15.5)
WBC: 5 10*3/uL (ref 4.0–10.5)
nRBC: 0 % (ref 0.0–0.2)

## 2022-03-19 ENCOUNTER — Other Ambulatory Visit: Payer: Self-pay

## 2022-03-19 ENCOUNTER — Ambulatory Visit (HOSPITAL_BASED_OUTPATIENT_CLINIC_OR_DEPARTMENT_OTHER): Payer: Federal, State, Local not specified - PPO | Admitting: Anesthesiology

## 2022-03-19 ENCOUNTER — Encounter (HOSPITAL_BASED_OUTPATIENT_CLINIC_OR_DEPARTMENT_OTHER)
Admission: RE | Disposition: A | Discharge status: Home / Self Care | Payer: Self-pay | Source: Home / Self Care | Attending: Obstetrics & Gynecology

## 2022-03-19 ENCOUNTER — Encounter (HOSPITAL_BASED_OUTPATIENT_CLINIC_OR_DEPARTMENT_OTHER): Payer: Self-pay | Admitting: Obstetrics & Gynecology

## 2022-03-19 ENCOUNTER — Observation Stay (HOSPITAL_BASED_OUTPATIENT_CLINIC_OR_DEPARTMENT_OTHER)
Admission: RE | Admit: 2022-03-19 | Discharge: 2022-03-20 | Disposition: A | Discharge status: Home / Self Care | Payer: Federal, State, Local not specified - PPO | Attending: Obstetrics & Gynecology | Admitting: Obstetrics & Gynecology

## 2022-03-19 DIAGNOSIS — N946 Dysmenorrhea, unspecified: Secondary | ICD-10-CM | POA: Diagnosis not present

## 2022-03-19 DIAGNOSIS — O4410 Placenta previa with hemorrhage, unspecified trimester: Secondary | ICD-10-CM | POA: Diagnosis not present

## 2022-03-19 DIAGNOSIS — I10 Essential (primary) hypertension: Secondary | ICD-10-CM | POA: Insufficient documentation

## 2022-03-19 DIAGNOSIS — Z90710 Acquired absence of both cervix and uterus: Secondary | ICD-10-CM | POA: Diagnosis present

## 2022-03-19 DIAGNOSIS — N920 Excessive and frequent menstruation with regular cycle: Secondary | ICD-10-CM | POA: Diagnosis not present

## 2022-03-19 DIAGNOSIS — Z01818 Encounter for other preprocedural examination: Secondary | ICD-10-CM

## 2022-03-19 DIAGNOSIS — O4413 Placenta previa with hemorrhage, third trimester: Secondary | ICD-10-CM

## 2022-03-19 HISTORY — DX: Depression, unspecified: F32.A

## 2022-03-19 HISTORY — DX: Anxiety disorder, unspecified: F41.9

## 2022-03-19 HISTORY — DX: Anemia, unspecified: D64.9

## 2022-03-19 HISTORY — DX: Tachycardia, unspecified: R00.0

## 2022-03-19 HISTORY — DX: Personal history of other diseases of the circulatory system: Z86.79

## 2022-03-19 HISTORY — DX: Presence of spectacles and contact lenses: Z97.3

## 2022-03-19 HISTORY — PX: LAPAROSCOPIC VAGINAL HYSTERECTOMY WITH SALPINGECTOMY: SHX6680

## 2022-03-19 HISTORY — DX: Benign neoplasm of connective and other soft tissue, unspecified: D21.9

## 2022-03-19 LAB — COMPREHENSIVE METABOLIC PANEL
ALT: 17 U/L (ref 0–44)
AST: 17 U/L (ref 15–41)
Albumin: 4.3 g/dL (ref 3.5–5.0)
Alkaline Phosphatase: 33 U/L — ABNORMAL LOW (ref 38–126)
Anion gap: 11 (ref 5–15)
BUN: 11 mg/dL (ref 6–20)
CO2: 24 mmol/L (ref 22–32)
Calcium: 9.2 mg/dL (ref 8.9–10.3)
Chloride: 100 mmol/L (ref 98–111)
Creatinine, Ser: 0.6 mg/dL (ref 0.44–1.00)
GFR, Estimated: >60 mL/min (ref >60)
Glucose, Bld: 88 mg/dL (ref 70–99)
Potassium: 3.4 mmol/L — ABNORMAL LOW (ref 3.5–5.1)
Sodium: 135 mmol/L (ref 135–145)
Total Bilirubin: 0.9 mg/dL (ref 0.3–1.2)
Total Protein: 7.1 g/dL (ref 6.5–8.1)

## 2022-03-19 LAB — TYPE AND SCREEN
ABO/RH(D): O POS
Antibody Screen: NEGATIVE

## 2022-03-19 LAB — POCT PREGNANCY, URINE: Preg Test, Ur: NEGATIVE

## 2022-03-19 SURGERY — HYSTERECTOMY, VAGINAL, LAPAROSCOPY-ASSISTED, WITH SALPINGECTOMY
Anesthesia: General | Site: Abdomen | Laterality: Bilateral

## 2022-03-19 MED ORDER — KETOROLAC TROMETHAMINE 15 MG/ML IJ SOLN
15.0000 mg | Freq: Four times a day (QID) | INTRAMUSCULAR | Status: DC | PRN
  Administered 2022-03-19 – 2022-03-20 (×3): 15 mg via INTRAVENOUS

## 2022-03-19 MED ORDER — MENTHOL 3 MG MT LOZG: 1.0000 | LOZENGE | OROMUCOSAL | Status: DC | PRN

## 2022-03-19 MED ORDER — PANTOPRAZOLE SODIUM 40 MG IV SOLR
INTRAVENOUS | Status: AC
  Filled 2022-03-19: qty 10

## 2022-03-19 MED ORDER — DIPHENHYDRAMINE HCL 25 MG PO CAPS: 50.0000 mg | ORAL_CAPSULE | Freq: Four times a day (QID) | ORAL | Status: DC | PRN

## 2022-03-19 MED ORDER — MIDAZOLAM HCL 2 MG/2ML IJ SOLN
INTRAMUSCULAR | Status: DC | PRN
  Administered 2022-03-19: 2 mg via INTRAVENOUS

## 2022-03-19 MED ORDER — BUPIVACAINE HCL (PF) 0.25 % IJ SOLN
INTRAMUSCULAR | Status: DC | PRN
  Administered 2022-03-19: 5 mL

## 2022-03-19 MED ORDER — SIMETHICONE 80 MG PO CHEW
CHEWABLE_TABLET | ORAL | Status: AC
  Filled 2022-03-19: qty 1

## 2022-03-19 MED ORDER — SUGAMMADEX SODIUM 200 MG/2ML IV SOLN
INTRAVENOUS | Status: DC | PRN
  Administered 2022-03-19: 150 mg via INTRAVENOUS

## 2022-03-19 MED ORDER — EPHEDRINE SULFATE-NACL 50-0.9 MG/10ML-% IV SOSY
PREFILLED_SYRINGE | INTRAVENOUS | Status: DC | PRN
  Administered 2022-03-19 (×2): 10 mg via INTRAVENOUS

## 2022-03-19 MED ORDER — ONDANSETRON HCL 4 MG/2ML IJ SOLN: 4.0000 mg | Freq: Four times a day (QID) | INTRAMUSCULAR | Status: DC | PRN

## 2022-03-19 MED ORDER — DEXAMETHASONE SODIUM PHOSPHATE 10 MG/ML IJ SOLN
INTRAMUSCULAR | Status: AC
  Filled 2022-03-19: qty 1

## 2022-03-19 MED ORDER — HYDROMORPHONE HCL 1 MG/ML IJ SOLN: 0.2000 mg | INTRAMUSCULAR | Status: DC | PRN

## 2022-03-19 MED ORDER — ACETAMINOPHEN 500 MG PO TABS
ORAL_TABLET | ORAL | Status: AC
  Filled 2022-03-19: qty 2

## 2022-03-19 MED ORDER — DOCUSATE SODIUM 100 MG PO CAPS
ORAL_CAPSULE | ORAL | Status: AC
  Filled 2022-03-19: qty 1

## 2022-03-19 MED ORDER — AMISULPRIDE (ANTIEMETIC) 5 MG/2ML IV SOLN: 10.0000 mg | Freq: Once | INTRAVENOUS | Status: DC | PRN

## 2022-03-19 MED ORDER — ESCITALOPRAM OXALATE 10 MG PO TABS
10.0000 mg | ORAL_TABLET | Freq: Every day | ORAL | Status: DC
  Administered 2022-03-19: 10 mg via ORAL
  Filled 2022-03-19: qty 1

## 2022-03-19 MED ORDER — SCOPOLAMINE 1 MG/3DAYS TD PT72
MEDICATED_PATCH | TRANSDERMAL | Status: AC
  Filled 2022-03-19: qty 1

## 2022-03-19 MED ORDER — FENTANYL CITRATE (PF) 100 MCG/2ML IJ SOLN
INTRAMUSCULAR | Status: DC | PRN
  Administered 2022-03-19 (×2): 50 ug via INTRAVENOUS

## 2022-03-19 MED ORDER — MIDAZOLAM HCL 2 MG/2ML IJ SOLN
INTRAMUSCULAR | Status: AC
  Filled 2022-03-19: qty 2

## 2022-03-19 MED ORDER — PROPOFOL 10 MG/ML IV BOLUS
INTRAVENOUS | Status: AC
  Filled 2022-03-19: qty 20

## 2022-03-19 MED ORDER — SIMETHICONE 80 MG PO CHEW
80.0000 mg | CHEWABLE_TABLET | Freq: Four times a day (QID) | ORAL | Status: DC | PRN
  Administered 2022-03-19 (×2): 80 mg via ORAL

## 2022-03-19 MED ORDER — PANTOPRAZOLE SODIUM 40 MG IV SOLR
40.0000 mg | Freq: Every day | INTRAVENOUS | Status: DC
  Administered 2022-03-19: 40 mg via INTRAVENOUS

## 2022-03-19 MED ORDER — DOCUSATE SODIUM 100 MG PO CAPS
100.0000 mg | ORAL_CAPSULE | Freq: Two times a day (BID) | ORAL | Status: DC
  Administered 2022-03-19 (×2): 100 mg via ORAL

## 2022-03-19 MED ORDER — LACTATED RINGERS IV SOLN: INTRAVENOUS | Status: DC

## 2022-03-19 MED ORDER — KETOROLAC TROMETHAMINE 15 MG/ML IJ SOLN
INTRAMUSCULAR | Status: AC
  Filled 2022-03-19: qty 1

## 2022-03-19 MED ORDER — DEXAMETHASONE SODIUM PHOSPHATE 10 MG/ML IJ SOLN
INTRAMUSCULAR | Status: DC | PRN
  Administered 2022-03-19 (×2): 5 mg via INTRAVENOUS

## 2022-03-19 MED ORDER — PROPOFOL 10 MG/ML IV BOLUS
INTRAVENOUS | Status: DC | PRN
  Administered 2022-03-19: 200 mg via INTRAVENOUS

## 2022-03-19 MED ORDER — HYDROCODONE-ACETAMINOPHEN 5-325 MG PO TABS: 1.0000 | ORAL_TABLET | Freq: Four times a day (QID) | ORAL | Status: DC | PRN

## 2022-03-19 MED ORDER — 0.9 % SODIUM CHLORIDE (POUR BTL) OPTIME
TOPICAL | Status: DC | PRN
  Administered 2022-03-19 (×2): 500 mL

## 2022-03-19 MED ORDER — ROCURONIUM BROMIDE 10 MG/ML (PF) SYRINGE
PREFILLED_SYRINGE | INTRAVENOUS | Status: DC | PRN
  Administered 2022-03-19: 10 mg via INTRAVENOUS
  Administered 2022-03-19: 50 mg via INTRAVENOUS
  Administered 2022-03-19 (×2): 10 mg via INTRAVENOUS

## 2022-03-19 MED ORDER — LIDOCAINE HCL (PF) 2 % IJ SOLN
INTRAMUSCULAR | Status: AC
  Filled 2022-03-19: qty 5

## 2022-03-19 MED ORDER — CLINDAMYCIN PHOSPHATE 900 MG/50ML IV SOLN
900.0000 mg | INTRAVENOUS | Status: AC
  Administered 2022-03-19: 900 mg via INTRAVENOUS

## 2022-03-19 MED ORDER — EPHEDRINE 5 MG/ML INJ
INTRAVENOUS | Status: AC
  Filled 2022-03-19: qty 5

## 2022-03-19 MED ORDER — ROCURONIUM BROMIDE 10 MG/ML (PF) SYRINGE
PREFILLED_SYRINGE | INTRAVENOUS | Status: AC
  Filled 2022-03-19: qty 10

## 2022-03-19 MED ORDER — LIDOCAINE 2% (20 MG/ML) 5 ML SYRINGE
INTRAMUSCULAR | Status: DC | PRN
  Administered 2022-03-19: 60 mg via INTRAVENOUS

## 2022-03-19 MED ORDER — DEXMEDETOMIDINE HCL IN NACL 80 MCG/20ML IV SOLN
INTRAVENOUS | Status: DC | PRN
  Administered 2022-03-19: 12 ug via BUCCAL

## 2022-03-19 MED ORDER — CLINDAMYCIN PHOSPHATE 900 MG/50ML IV SOLN
INTRAVENOUS | Status: AC
  Filled 2022-03-19: qty 50

## 2022-03-19 MED ORDER — ONDANSETRON HCL 4 MG PO TABS: 4.0000 mg | ORAL_TABLET | Freq: Four times a day (QID) | ORAL | Status: DC | PRN

## 2022-03-19 MED ORDER — ONDANSETRON HCL 4 MG/2ML IJ SOLN
INTRAMUSCULAR | Status: DC | PRN
  Administered 2022-03-19: 4 mg via INTRAVENOUS

## 2022-03-19 MED ORDER — HYDROMORPHONE HCL 1 MG/ML IJ SOLN: 0.2500 mg | INTRAMUSCULAR | Status: DC | PRN

## 2022-03-19 MED ORDER — ONDANSETRON HCL 4 MG/2ML IJ SOLN
INTRAMUSCULAR | Status: AC
  Filled 2022-03-19: qty 2

## 2022-03-19 MED ORDER — WHITE PETROLATUM EX OINT
TOPICAL_OINTMENT | CUTANEOUS | Status: AC
  Filled 2022-03-19: qty 5

## 2022-03-19 MED ORDER — FENTANYL CITRATE (PF) 100 MCG/2ML IJ SOLN
INTRAMUSCULAR | Status: AC
  Filled 2022-03-19: qty 2

## 2022-03-19 MED ORDER — GENTAMICIN SULFATE 40 MG/ML IJ SOLN
5.0000 mg/kg | INTRAVENOUS | Status: AC
  Administered 2022-03-19: 270 mg via INTRAVENOUS
  Filled 2022-03-19: qty 6.75

## 2022-03-19 MED ORDER — MEPERIDINE HCL 25 MG/ML IJ SOLN: 6.2500 mg | INTRAMUSCULAR | Status: DC | PRN

## 2022-03-19 MED ORDER — ACETAMINOPHEN 500 MG PO TABS
1000.0000 mg | ORAL_TABLET | Freq: Four times a day (QID) | ORAL | Status: DC
  Administered 2022-03-19 – 2022-03-20 (×4): 1000 mg via ORAL

## 2022-03-19 MED ORDER — PROMETHAZINE HCL 25 MG/ML IJ SOLN: 6.2500 mg | INTRAMUSCULAR | Status: DC | PRN

## 2022-03-19 MED ORDER — POVIDONE-IODINE 10 % EX SWAB: 2.0000 | Freq: Once | CUTANEOUS | Status: DC

## 2022-03-19 SURGICAL SUPPLY — 60 items
ADH SKN CLS APL DERMABOND .7 (GAUZE/BANDAGES/DRESSINGS) ×1
CATH ROBINSON RED A/P 16FR (CATHETERS) ×1 IMPLANT
COVER BACK TABLE 60X90IN (DRAPES) ×1 IMPLANT
COVER MAYO STAND STRL (DRAPES) ×2 IMPLANT
DERMABOND ADVANCED .7 DNX12 (GAUZE/BANDAGES/DRESSINGS) IMPLANT
DRAPE SURG IRRIG POUCH 19X23 (DRAPES) ×1 IMPLANT
DRSG COVADERM PLUS 2X2 (GAUZE/BANDAGES/DRESSINGS) ×1 IMPLANT
DRSG OPSITE POSTOP 3X4 (GAUZE/BANDAGES/DRESSINGS) ×1 IMPLANT
DURAPREP 26ML APPLICATOR (WOUND CARE) ×1 IMPLANT
ELECT REM PT RETURN 9FT ADLT (ELECTROSURGICAL) ×1
ELECTRODE REM PT RTRN 9FT ADLT (ELECTROSURGICAL) IMPLANT
FORCEPS CUTTING 45CM 5MM (CUTTING FORCEPS) IMPLANT
GAUZE 4X4 16PLY ~~LOC~~+RFID DBL (SPONGE) ×3 IMPLANT
GLOVE BIO SURGEON STRL SZ 6 (GLOVE) ×3 IMPLANT
GLOVE BIO SURGEON STRL SZ 6.5 (GLOVE) IMPLANT
GLOVE BIOGEL PI IND STRL 6 (GLOVE) ×2 IMPLANT
GLOVE BIOGEL PI IND STRL 7.0 (GLOVE) IMPLANT
GLOVE ECLIPSE 6.0 STRL STRAW (GLOVE) ×3 IMPLANT
GLOVE ECLIPSE 6.5 STRL STRAW (GLOVE) ×2 IMPLANT
GLOVE SS PI  5.5 STRL (GLOVE) ×2
GLOVE SS PI 5.5 STRL (GLOVE) ×2 IMPLANT
GLOVE SURG SS PI 6.5 STRL IVOR (GLOVE) IMPLANT
GLOVE SURG SS PI 7.5 STRL IVOR (GLOVE) IMPLANT
GOWN STRL REUS W/ TWL LRG LVL3 (GOWN DISPOSABLE) IMPLANT
GOWN STRL REUS W/TWL LRG LVL3 (GOWN DISPOSABLE) ×1
HOLDER FOLEY CATH W/STRAP (MISCELLANEOUS) ×1 IMPLANT
IV NS 1000ML (IV SOLUTION)
IV NS 1000ML BAXH (IV SOLUTION) IMPLANT
KIT PINK PAD W/HEAD ARE REST (MISCELLANEOUS) ×1
KIT PINK PAD W/HEAD ARM REST (MISCELLANEOUS) ×1 IMPLANT
KIT TURNOVER CYSTO (KITS) ×1 IMPLANT
LIGASURE IMPACT 36 18CM CVD LR (INSTRUMENTS) IMPLANT
MANIPULATOR UTERINE 4.5 ZUMI (MISCELLANEOUS) IMPLANT
NDL INSUFFLATION 14GA 120MM (NEEDLE) ×1 IMPLANT
NEEDLE INSUFFLATION 14GA 120MM (NEEDLE) ×1 IMPLANT
NS IRRIG 500ML POUR BTL (IV SOLUTION) ×1 IMPLANT
PACK LAVH (CUSTOM PROCEDURE TRAY) ×1 IMPLANT
PACK ROBOTIC GOWN (GOWN DISPOSABLE) ×1 IMPLANT
PAD OB MATERNITY 4.3X12.25 (PERSONAL CARE ITEMS) ×1 IMPLANT
PAD PREP 24X48 CUFFED NSTRL (MISCELLANEOUS) ×1 IMPLANT
SCISSORS LAP 5X35 DISP (ENDOMECHANICALS) IMPLANT
SET SUCTION IRRIG HYDROSURG (IRRIGATION / IRRIGATOR) IMPLANT
SET TUBE SMOKE EVAC HIGH FLOW (TUBING) ×1 IMPLANT
SLEEVE SCD COMPRESS KNEE MED (STOCKING) ×1 IMPLANT
SPIKE FLUID TRANSFER (MISCELLANEOUS) IMPLANT
SPONGE T-LAP 4X18 ~~LOC~~+RFID (SPONGE) ×1 IMPLANT
SUT MNCRL 0 MO-4 VIOLET 18 CR (SUTURE) ×2 IMPLANT
SUT MNCRL AB 3-0 PS2 18 (SUTURE) ×1 IMPLANT
SUT MON AB 2-0 CT1 36 (SUTURE) IMPLANT
SUT MONOCRYL 0 MO 4 18  CR/8 (SUTURE) ×2
SUT VIC AB 0 CT1 27 (SUTURE)
SUT VIC AB 0 CT1 27XCR 8 STRN (SUTURE) IMPLANT
SUT VICRYL 0 TIES 12 18 (SUTURE) ×1 IMPLANT
SUT VICRYL 0 UR6 27IN ABS (SUTURE) ×1 IMPLANT
SYR BULB IRRIG 60ML STRL (SYRINGE) IMPLANT
TOWEL OR 17X26 10 PK STRL BLUE (TOWEL DISPOSABLE) ×1 IMPLANT
TRAY FOLEY W/BAG SLVR 14FR LF (SET/KITS/TRAYS/PACK) ×1 IMPLANT
TROCAR Z-THREAD FIOS 11X100 BL (TROCAR) ×1 IMPLANT
TROCAR Z-THREAD FIOS 5X100MM (TROCAR) ×1 IMPLANT
WARMER LAPAROSCOPE (MISCELLANEOUS) ×1 IMPLANT

## 2022-03-19 NOTE — Anesthesia Preprocedure Evaluation (Addendum)
Anesthesia Evaluation  Patient identified by MRN, date of birth, ID band Patient awake    Reviewed: Allergy & Precautions, NPO status , Patient's Chart, lab work & pertinent test results  Airway Mallampati: I  TM Distance: >3 FB Neck ROM: Full    Dental no notable dental hx. (+) Dental Advisory Given, Teeth Intact   Pulmonary neg pulmonary ROS   Pulmonary exam normal breath sounds clear to auscultation       Cardiovascular negative cardio ROS Normal cardiovascular exam Rhythm:Regular Rate:Normal     Neuro/Psych  PSYCHIATRIC DISORDERS Anxiety Depression    negative neurological ROS     GI/Hepatic negative GI ROS, Neg liver ROS,,,  Endo/Other  negative endocrine ROS    Renal/GU negative Renal ROS     Musculoskeletal negative musculoskeletal ROS (+)    Abdominal   Peds  Hematology  (+) Blood dyscrasia, anemia   Anesthesia Other Findings   Reproductive/Obstetrics                             Anesthesia Physical Anesthesia Plan  ASA: 2  Anesthesia Plan: General   Post-op Pain Management: Tylenol PO (pre-op)* and Toradol IV (intra-op)*   Induction: Intravenous  PONV Risk Score and Plan: 4 or greater and Ondansetron, Dexamethasone, Midazolam, Treatment may vary due to age or medical condition and Scopolamine patch - Pre-op  Airway Management Planned: Oral ETT  Additional Equipment: None  Intra-op Plan:   Post-operative Plan: Extubation in OR  Informed Consent: I have reviewed the patients History and Physical, chart, labs and discussed the procedure including the risks, benefits and alternatives for the proposed anesthesia with the patient or authorized representative who has indicated his/her understanding and acceptance.     Dental advisory given  Plan Discussed with: CRNA  Anesthesia Plan Comments:        Anesthesia Quick Evaluation

## 2022-03-19 NOTE — Transfer of Care (Signed)
Immediate Anesthesia Transfer of Care Note  Patient: Kelly Keller  Procedure(s) Performed: Procedure(s) (LRB): LAPAROSCOPIC ASSISTED VAGINAL HYSTERECTOMY WITH SALPINGECTOMY (Bilateral)  Patient Location: PACU  Anesthesia Type: General  Level of Consciousness: awake, oriented, sedated and patient cooperative  Airway & Oxygen Therapy: Patient Spontanous Breathing and Patient connected to face mask oxygen  Post-op Assessment: Report given to PACU RN and Post -op Vital signs reviewed and stable  Post vital signs: Reviewed and stable  Complications: No apparent anesthesia complications Last Vitals:  Vitals Value Taken Time  BP 129/86 03/19/22 0945  Temp    Pulse 99 03/19/22 0946  Resp 24 03/19/22 0946  SpO2 100 % 03/19/22 0946  Vitals shown include unvalidated device data.  Last Pain:  Vitals:   03/19/22 0600  TempSrc: Oral  PainSc: 0-No pain      Patients Stated Pain Goal: 5 ([id number])  Complications: No notable events documented.

## 2022-03-19 NOTE — Op Note (Signed)
PROCEDURE DATE: 03/19/2022 PREOPERATIVE DIAGNOSIS: Menorrhagia, dysmenorrhea  POSTOPERATIVE DIAGNOSIS: The same  PROCEDURE: Laparoscopic Assisted Vaginal Hysterectomy, Bilateral Salpingectomy SURGEON: Dr. Linda Hedges  ASSISTANT: Dr. Arvella Nigh INDICATIONS: 42 y.o. with menorrhagia and dysmenorrhea desiring definitive surgical management. Risks of surgery were discussed with the patient including but not limited to: bleeding which may require transfusion or reoperation; infection which may require antibiotics; injury to bowel, bladder, ureters or other surrounding organs; need for additional procedures including laparotomy; thromboembolic phenomenon, incisional problems and other postoperative/anesthesia complications. Written informed consent was obtained.  FINDINGS: Slightly enlarged, boggy uterus, normal adnexa bilaterally. No evidence of endometriosis. Normal upper abdomen.  ANESTHESIA: General  ESTIMATED BLOOD LOSS: 75 ml  SPECIMENS: Uterus and cervix, bilateral fallopian tubes COMPLICATIONS: None immediate  PROCEDURE IN DETAIL: The patient received intravenous antibiotics and had sequential compression devices applied to her lower extremities while in the preoperative area. She was then taken to the operating room where general anesthesia was administered and was found to be adequate. She was placed in the dorsal lithotomy position, and was prepped and draped in a sterile manner. An in and out catheterization was performed. A uterine manipulator was then advanced into the uterus . After an adequate timeout was performed, attention was then turned to the patient's abdomen where a 10-mm skin incision was made in the umbilical fold. The Veress needle was carefully introduced into the peritoneal cavity through the abdominal wall. Intraperitoneal placement was confirmed by drop in intraabdominal pressure with insufflation of carbon dioxide gas. Adequate pneumoperitoneum was obtained, and the 10/11 XL  trocar and sleeve were then advanced without difficulty into the abdomen where intraabdominal placement was confirmed by the laparoscope. A survey of the patient's pelvis and abdomen revealed entirely normal anatomy. Suprapubic 5 XL port was then placed under direct visualization. The pelvis was then carefully examined. On the right side, the fallopian tube was elevated and freed from the mesosalpinx using Gyrus in serial clamp, coagulate, cut bites.  The improve visualization, the right fallopian tube was excised at the cornual region and removed from the umbilical port.  The round ligament was then clamped and transected with the Gyrus. The uteroovarian ligament was also clamped and transected. The leaves of the broad ligament were separated and serially transected. These procedures were then repeated on the left side but leaving the left fallopian tube in place. The ureters were noted to be safely away from the area of dissection.  At this point, attention was turned to the vaginal portion of the case. A weighted speculum was placed posteriorly, a Deaver anteriorly, and the cervix grasped with a thyroid tenaculum. Once the anterior and posterior reflections were identified, the cervix was circumscribed using the Bovie knife. Next, using Mayos, the posterior cul-de-sac was entered. The uterosacral ligaments were clamped and suture ligation, cut and tagged bilaterally.  Next, the bladder reflection was identified. Using Metzenbaums, it was entered and palpation and direct visualization confirmed proper location. Next, using the LigaSure, the uterine arteries were coapted and cut bilaterally. The pedicles were visualized after coaptation and were hemostatic. The same was performed sequentially cephalad until the uterus, cervix, and left fallopian tube were removed. The pedicles were inspected and found to be hemostatic.  Next, the tagged uterosacrals were tied together in midline.  The remainder of the cuff was  closed using figure-of-8 stitch using monocryl. The cuff was inspected and found to be hemostatic. Foley catheter was placed and drained clear yellow urine. Attention was returned to the abdomen  were a second laparoscopic look was taken. Suction irrigation was performed.  All pedicles were hemostatic.  Insufflation was removed after all instruments were removed.  The infraumbilical fascial incision was closed using 0 vicryl in a figure of eight stitch.  All skin incisions were closed with 4-0 monocryl subcuticular stitches and Dermabond. The patient tolerated the procedures well. All instruments, needles, and sponge counts were correct x 2. The patient was taken to the recovery room awake, extubated and in stable condition.

## 2022-03-19 NOTE — Progress Notes (Signed)
No change to H&P.  Cherell Colvin, DO 

## 2022-03-19 NOTE — Anesthesia Procedure Notes (Signed)
Procedure Name: Intubation Date/Time: 03/19/2022 7:36 AM  Performed by: Suan Halter, CRNAPre-anesthesia Checklist: Patient identified, Emergency Drugs available, Suction available and Patient being monitored Patient Re-evaluated:Patient Re-evaluated prior to induction Oxygen Delivery Method: Circle system utilized Preoxygenation: Pre-oxygenation with 100% oxygen Induction Type: IV induction Ventilation: Mask ventilation without difficulty Laryngoscope Size: Mac and 3 Grade View: Grade I Tube type: Oral Number of attempts: 1 Airway Equipment and Method: Stylet and Oral airway Placement Confirmation: ETT inserted through vocal cords under direct vision, positive ETCO2 and breath sounds checked- equal and bilateral Secured at: 21 cm Tube secured with: Tape Dental Injury: Teeth and Oropharynx as per pre-operative assessment

## 2022-03-19 NOTE — Progress Notes (Signed)
No c/o.  Pain is well controlled with Tylenol.  No N/V.  No CP/SOB.     03/19/2022   12:30 PM 03/19/2022   11:30 AM 03/19/2022   10:50 AM  Vitals with BMI  Systolic [id number]  Diastolic 78 71 67  Pulse 99 92 78   Gen: A&O x 3 Abd: soft, ND.  Inc c/d/I x 2 Ext: no c/c/e with SCDs on  Urine is clear yellow and adequate  POD#0 s/p LAVH, BS -Advance diet -Ambulate -AM labs pending -D/C foley in AM  Linda Hedges, DO

## 2022-03-19 NOTE — Anesthesia Postprocedure Evaluation (Signed)
Anesthesia Post Note  Patient: Kelly Keller  Procedure(s) Performed: LAPAROSCOPIC ASSISTED VAGINAL HYSTERECTOMY WITH SALPINGECTOMY (Bilateral: Abdomen)     Patient location during evaluation: PACU Anesthesia Type: General Level of consciousness: sedated and patient cooperative Pain management: pain level controlled Vital Signs Assessment: post-procedure vital signs reviewed and stable Respiratory status: spontaneous breathing Cardiovascular status: stable Anesthetic complications: no   No notable events documented.  Last Vitals:  Vitals:   03/19/22 1015 03/19/22 1030  BP: 126/79 126/82  Pulse: 85 83  Resp: (!) 21 20  Temp:    SpO2: 100% 97%    Last Pain:  Vitals:   03/19/22 1015  TempSrc:   PainSc: 0-No pain                 Nolon Nations

## 2022-03-20 ENCOUNTER — Encounter (HOSPITAL_BASED_OUTPATIENT_CLINIC_OR_DEPARTMENT_OTHER): Payer: Self-pay | Admitting: Obstetrics & Gynecology

## 2022-03-20 DIAGNOSIS — N920 Excessive and frequent menstruation with regular cycle: Secondary | ICD-10-CM | POA: Diagnosis not present

## 2022-03-20 DIAGNOSIS — N946 Dysmenorrhea, unspecified: Secondary | ICD-10-CM | POA: Diagnosis not present

## 2022-03-20 DIAGNOSIS — I10 Essential (primary) hypertension: Secondary | ICD-10-CM | POA: Diagnosis not present

## 2022-03-20 LAB — COMPREHENSIVE METABOLIC PANEL
ALT: 14 U/L (ref 0–44)
AST: 12 U/L — ABNORMAL LOW (ref 15–41)
Albumin: 3.8 g/dL (ref 3.5–5.0)
Alkaline Phosphatase: 29 U/L — ABNORMAL LOW (ref 38–126)
Anion gap: 4 — ABNORMAL LOW (ref 5–15)
BUN: 8 mg/dL (ref 6–20)
CO2: 24 mmol/L (ref 22–32)
Calcium: 8.5 mg/dL — ABNORMAL LOW (ref 8.9–10.3)
Chloride: 109 mmol/L (ref 98–111)
Creatinine, Ser: 0.63 mg/dL (ref 0.44–1.00)
GFR, Estimated: >60 mL/min (ref >60)
Glucose, Bld: 110 mg/dL — ABNORMAL HIGH (ref 70–99)
Potassium: 3.9 mmol/L (ref 3.5–5.1)
Sodium: 137 mmol/L (ref 135–145)
Total Bilirubin: 0.8 mg/dL (ref 0.3–1.2)
Total Protein: 6.4 g/dL — ABNORMAL LOW (ref 6.5–8.1)

## 2022-03-20 LAB — SURGICAL PATHOLOGY

## 2022-03-20 LAB — CBC
HCT: 30.5 % — ABNORMAL LOW (ref 36.0–46.0)
Hemoglobin: 10 g/dL — ABNORMAL LOW (ref 12.0–15.0)
MCH: 30 pg (ref 26.0–34.0)
MCHC: 32.8 g/dL (ref 30.0–36.0)
MCV: 91.6 fL (ref 80.0–100.0)
Platelets: 184 10*3/uL (ref 150–400)
RBC: 3.33 MIL/uL — ABNORMAL LOW (ref 3.87–5.11)
RDW: 12 % (ref 11.5–15.5)
WBC: 8.5 10*3/uL (ref 4.0–10.5)
nRBC: 0 % (ref 0.0–0.2)

## 2022-03-20 MED ORDER — ACETAMINOPHEN 500 MG PO TABS
ORAL_TABLET | ORAL | Status: AC
  Filled 2022-03-20: qty 2

## 2022-03-20 MED ORDER — KETOROLAC TROMETHAMINE 15 MG/ML IJ SOLN
INTRAMUSCULAR | Status: AC
  Filled 2022-03-20: qty 1

## 2022-03-20 NOTE — Progress Notes (Signed)
1 Day Post-Op Procedure(s) (LRB): LAPAROSCOPIC ASSISTED VAGINAL HYSTERECTOMY WITH SALPINGECTOMY (Bilateral)  Subjective: Patient reports tolerating PO and + flatus.  Has not yet voided this am after foley out.  Pain well controlled with tylenol and toradol.  No N/V.    Objective: I have reviewed patient's vital signs, intake and output, medications, and labs.  General: alert, cooperative, and appears stated age Extremities: extremities normal, atraumatic, no cyanosis or edema Abd: soft, ND.  Incisions c/d/I x 2  Assessment: s/p Procedure(s): LAPAROSCOPIC ASSISTED VAGINAL HYSTERECTOMY WITH SALPINGECTOMY (Bilateral): stable, progressing well, and tolerating diet  Plan: Discharge home once voiding.  LOS: 0 days    Linda Hedges, DO 03/20/2022, 7:14 AM

## 2022-03-20 NOTE — Discharge Summary (Signed)
Physician Discharge Summary  Patient ID: ITZY PEPITONE MRN: [id number] DOB/AGE: 1980-06-05 42 y.o.  Admit date: 03/19/2022 Discharge date: 03/20/2022  Admission Diagnoses: Menorrhagia, dysmenorrhea  Discharge Diagnoses:  Principal Problem:   S/P hysterectomy Active Problems:   Menorrhagia   Discharged Condition: good  Hospital Course: Patient was admitted on 2/19 for anticipated LAVH, BS to treat menorrhagia and dysmenorrhea.  She underwent the anticipate procedure without complication.  She had an uncomplicated postop course and was discharged home on POD#1 when she was tolerating po, ambulating well, voiding without difficulty, passing flatus, and had adequate pain control.    Consults: None  Significant Diagnostic Studies: n/a  Treatments: surgery: LAVH, BS  Discharge Exam: Blood pressure 128/80, pulse 74, temperature 98.1 F (36.7 C), resp. rate 20, height 5' 1"$  (1.549 m), weight 71.3 kg, last menstrual period 03/08/2022, SpO2 98 %, not currently breastfeeding. General appearance: alert, cooperative, and appears stated age Extremities: extremities normal, atraumatic, no cyanosis or edema Incision/Wound: Abd: soft, ND. Incisions c/d/I x 2  Disposition: Discharge disposition: 01-Home or Self Care       Discharge Instructions     Discharge patient   Complete by: As directed    Discharge disposition: 01-Home or Self Care   Discharge patient date: 03/20/2022      Allergies as of 03/20/2022       Reactions   Nsaids    Hx bariatric surgery - avoids   Penicillins Rash   Has patient had a PCN reaction causing immediate rash, facial/tongue/throat swelling, SOB or lightheadedness with hypotension: unknown Has patient had a PCN reaction causing severe rash involving mucus membranes or skin necrosis: unknown Has patient had a PCN reaction that required hospitalization: No Has patient had a PCN reaction occurring within the last 10 years: No If all of the above answers  are "NO", then may proceed with Cephalosporin use.   Tylenol With Codeine #3 [acetaminophen-codeine] Rash   Only allergic to codeine, not Tylenol.        Medication List     TAKE these medications    ALPRAZolam 0.25 MG tablet Commonly known as: XANAX Take 1 tablet by mouth as needed.   Biotin 1000 MCG Chew Chew by mouth.   escitalopram 10 MG tablet Commonly known as: LEXAPRO Take 10 mg by mouth daily.   ferrous sulfate 324 MG Tbec Take 324 mg by mouth. 2 tablets daily   multivitamin with minerals tablet Take 1 tablet by mouth daily.   omeprazole 40 MG capsule Commonly known as: PRILOSEC Take 40 mg by mouth as needed.   VITAMIN D PO Take by mouth.         Signed: Linda Hedges 03/20/2022, 7:18 AM

## 2022-03-20 NOTE — Discharge Instructions (Signed)
Call MD for T>100.4, heavy vaginal bleeding, severe abdominal pain, intractable nausea and/or vomiting, or respiratory distress.  Call office to schedule postop visit in 2 weeks.  Pelvic rest and no heavy lifting x 6 weeks.  No driving until cleared in 2 weeks.

## 2022-04-26 DIAGNOSIS — F411 Generalized anxiety disorder: Secondary | ICD-10-CM | POA: Diagnosis not present

## 2022-04-26 DIAGNOSIS — E7800 Pure hypercholesterolemia, unspecified: Secondary | ICD-10-CM | POA: Diagnosis not present

## 2022-04-26 DIAGNOSIS — E611 Iron deficiency: Secondary | ICD-10-CM | POA: Diagnosis not present

## 2022-04-26 DIAGNOSIS — E559 Vitamin D deficiency, unspecified: Secondary | ICD-10-CM | POA: Diagnosis not present

## 2022-04-26 DIAGNOSIS — Z0000 Encounter for general adult medical examination without abnormal findings: Secondary | ICD-10-CM | POA: Diagnosis not present

## 2022-09-19 DIAGNOSIS — K912 Postsurgical malabsorption, not elsewhere classified: Secondary | ICD-10-CM | POA: Diagnosis not present

## 2022-09-19 DIAGNOSIS — Z903 Acquired absence of stomach [part of]: Secondary | ICD-10-CM | POA: Diagnosis not present

## 2022-10-05 DIAGNOSIS — Z903 Acquired absence of stomach [part of]: Secondary | ICD-10-CM | POA: Diagnosis not present

## 2022-10-05 DIAGNOSIS — Z1330 Encounter for screening examination for mental health and behavioral disorders, unspecified: Secondary | ICD-10-CM | POA: Diagnosis not present

## 2022-10-05 DIAGNOSIS — E559 Vitamin D deficiency, unspecified: Secondary | ICD-10-CM | POA: Diagnosis not present

## 2022-10-05 DIAGNOSIS — R030 Elevated blood-pressure reading, without diagnosis of hypertension: Secondary | ICD-10-CM | POA: Diagnosis not present

## 2022-10-05 DIAGNOSIS — K912 Postsurgical malabsorption, not elsewhere classified: Secondary | ICD-10-CM | POA: Diagnosis not present

## 2022-10-29 DIAGNOSIS — M85851 Other specified disorders of bone density and structure, right thigh: Secondary | ICD-10-CM | POA: Diagnosis not present

## 2022-10-29 DIAGNOSIS — M8589 Other specified disorders of bone density and structure, multiple sites: Secondary | ICD-10-CM | POA: Diagnosis not present

## 2022-10-29 DIAGNOSIS — I10 Essential (primary) hypertension: Secondary | ICD-10-CM | POA: Diagnosis not present

## 2022-10-29 DIAGNOSIS — F419 Anxiety disorder, unspecified: Secondary | ICD-10-CM | POA: Diagnosis not present

## 2022-10-29 DIAGNOSIS — Z9884 Bariatric surgery status: Secondary | ICD-10-CM | POA: Diagnosis not present

## 2022-11-07 DIAGNOSIS — Z1151 Encounter for screening for human papillomavirus (HPV): Secondary | ICD-10-CM | POA: Diagnosis not present

## 2022-11-07 DIAGNOSIS — Z01419 Encounter for gynecological examination (general) (routine) without abnormal findings: Secondary | ICD-10-CM | POA: Diagnosis not present

## 2022-11-07 DIAGNOSIS — Z1272 Encounter for screening for malignant neoplasm of vagina: Secondary | ICD-10-CM | POA: Diagnosis not present

## 2022-11-07 DIAGNOSIS — Z1231 Encounter for screening mammogram for malignant neoplasm of breast: Secondary | ICD-10-CM | POA: Diagnosis not present

## 2022-11-12 DIAGNOSIS — I10 Essential (primary) hypertension: Secondary | ICD-10-CM | POA: Diagnosis not present

## 2023-02-03 DIAGNOSIS — B349 Viral infection, unspecified: Secondary | ICD-10-CM | POA: Diagnosis not present

## 2023-02-03 DIAGNOSIS — R509 Fever, unspecified: Secondary | ICD-10-CM | POA: Diagnosis not present

## 2023-02-04 ENCOUNTER — Ambulatory Visit: Payer: Federal, State, Local not specified - PPO

## 2023-05-27 DIAGNOSIS — E559 Vitamin D deficiency, unspecified: Secondary | ICD-10-CM | POA: Diagnosis not present

## 2023-05-27 DIAGNOSIS — F419 Anxiety disorder, unspecified: Secondary | ICD-10-CM | POA: Diagnosis not present

## 2023-05-27 DIAGNOSIS — E611 Iron deficiency: Secondary | ICD-10-CM | POA: Diagnosis not present

## 2023-05-27 DIAGNOSIS — I10 Essential (primary) hypertension: Secondary | ICD-10-CM | POA: Diagnosis not present

## 2023-09-19 DIAGNOSIS — K912 Postsurgical malabsorption, not elsewhere classified: Secondary | ICD-10-CM | POA: Diagnosis not present

## 2023-09-19 DIAGNOSIS — Z903 Acquired absence of stomach [part of]: Secondary | ICD-10-CM | POA: Diagnosis not present

## 2023-09-26 DIAGNOSIS — L720 Epidermal cyst: Secondary | ICD-10-CM | POA: Diagnosis not present

## 2023-09-26 DIAGNOSIS — L7211 Pilar cyst: Secondary | ICD-10-CM | POA: Diagnosis not present

## 2023-10-14 DIAGNOSIS — M8580 Other specified disorders of bone density and structure, unspecified site: Secondary | ICD-10-CM | POA: Diagnosis not present

## 2023-10-14 DIAGNOSIS — Z903 Acquired absence of stomach [part of]: Secondary | ICD-10-CM | POA: Diagnosis not present

## 2023-10-14 DIAGNOSIS — Z6833 Body mass index (BMI) 33.0-33.9, adult: Secondary | ICD-10-CM | POA: Diagnosis not present

## 2023-10-14 DIAGNOSIS — E669 Obesity, unspecified: Secondary | ICD-10-CM | POA: Diagnosis not present

## 2023-10-14 DIAGNOSIS — Z1330 Encounter for screening examination for mental health and behavioral disorders, unspecified: Secondary | ICD-10-CM | POA: Diagnosis not present

## 2023-11-07 DIAGNOSIS — F419 Anxiety disorder, unspecified: Secondary | ICD-10-CM | POA: Diagnosis not present

## 2023-11-07 DIAGNOSIS — E611 Iron deficiency: Secondary | ICD-10-CM | POA: Diagnosis not present

## 2023-11-07 DIAGNOSIS — E7800 Pure hypercholesterolemia, unspecified: Secondary | ICD-10-CM | POA: Diagnosis not present

## 2023-11-07 DIAGNOSIS — I10 Essential (primary) hypertension: Secondary | ICD-10-CM | POA: Diagnosis not present

## 2023-11-07 DIAGNOSIS — Z0000 Encounter for general adult medical examination without abnormal findings: Secondary | ICD-10-CM | POA: Diagnosis not present

## 2023-11-07 DIAGNOSIS — E559 Vitamin D deficiency, unspecified: Secondary | ICD-10-CM | POA: Diagnosis not present

## 2023-11-07 DIAGNOSIS — Z1331 Encounter for screening for depression: Secondary | ICD-10-CM | POA: Diagnosis not present

## 2023-11-11 DIAGNOSIS — Z6832 Body mass index (BMI) 32.0-32.9, adult: Secondary | ICD-10-CM | POA: Diagnosis not present

## 2023-11-11 DIAGNOSIS — Z1231 Encounter for screening mammogram for malignant neoplasm of breast: Secondary | ICD-10-CM | POA: Diagnosis not present

## 2023-11-11 DIAGNOSIS — Z01419 Encounter for gynecological examination (general) (routine) without abnormal findings: Secondary | ICD-10-CM | POA: Diagnosis not present

## 2023-12-10 DIAGNOSIS — Z6831 Body mass index (BMI) 31.0-31.9, adult: Secondary | ICD-10-CM | POA: Diagnosis not present

## 2023-12-10 DIAGNOSIS — Z903 Acquired absence of stomach [part of]: Secondary | ICD-10-CM | POA: Diagnosis not present

## 2023-12-10 DIAGNOSIS — R634 Abnormal weight loss: Secondary | ICD-10-CM | POA: Diagnosis not present
